# Patient Record
Sex: Male | Born: 1975 | Race: Asian | Hispanic: No | Smoking: Former smoker
Health system: Southern US, Community
[De-identification: ages and names within clinical notes are randomized; demographics above are authoritative.]

## PROBLEM LIST (undated history)

## (undated) DIAGNOSIS — F329 Major depressive disorder, single episode, unspecified: Secondary | ICD-10-CM

## (undated) DIAGNOSIS — F32A Depression, unspecified: Secondary | ICD-10-CM

## (undated) DIAGNOSIS — R200 Anesthesia of skin: Secondary | ICD-10-CM

## (undated) HISTORY — DX: Depression, unspecified: F32.A

## (undated) HISTORY — PX: NO PAST SURGERIES: SHX2092

## (undated) HISTORY — DX: Major depressive disorder, single episode, unspecified: F32.9

## (undated) HISTORY — DX: Anesthesia of skin: R20.0

---

## 2001-08-15 ENCOUNTER — Encounter: Payer: Self-pay | Admitting: Emergency Medicine

## 2001-08-15 ENCOUNTER — Emergency Department (HOSPITAL_COMMUNITY): Admission: EM | Admit: 2001-08-15 | Discharge: 2001-08-15 | Payer: Self-pay | Admitting: Emergency Medicine

## 2008-11-02 ENCOUNTER — Emergency Department (HOSPITAL_COMMUNITY): Admission: EM | Admit: 2008-11-02 | Discharge: 2008-11-03 | Payer: Self-pay | Admitting: Emergency Medicine

## 2009-08-09 ENCOUNTER — Emergency Department (HOSPITAL_COMMUNITY): Admission: EM | Admit: 2009-08-09 | Discharge: 2009-08-09 | Payer: Self-pay | Admitting: Emergency Medicine

## 2011-08-12 ENCOUNTER — Emergency Department (HOSPITAL_COMMUNITY)
Admission: EM | Admit: 2011-08-12 | Discharge: 2011-08-13 | Disposition: A | Discharge status: Home / Self Care | Payer: BC Managed Care – PPO | Attending: Emergency Medicine | Admitting: Emergency Medicine

## 2011-08-12 ENCOUNTER — Emergency Department (HOSPITAL_COMMUNITY): Payer: BC Managed Care – PPO

## 2011-08-12 DIAGNOSIS — S91109A Unspecified open wound of unspecified toe(s) without damage to nail, initial encounter: Secondary | ICD-10-CM | POA: Insufficient documentation

## 2011-08-12 DIAGNOSIS — M79609 Pain in unspecified limb: Secondary | ICD-10-CM | POA: Insufficient documentation

## 2011-08-12 DIAGNOSIS — W208XXA Other cause of strike by thrown, projected or falling object, initial encounter: Secondary | ICD-10-CM | POA: Insufficient documentation

## 2015-04-06 ENCOUNTER — Emergency Department (HOSPITAL_COMMUNITY)
Admission: EM | Admit: 2015-04-06 | Discharge: 2015-04-06 | Disposition: A | Discharge status: Home / Self Care | Payer: 59 | Source: Home / Self Care | Attending: Family Medicine | Admitting: Family Medicine

## 2015-04-06 ENCOUNTER — Encounter (HOSPITAL_COMMUNITY): Payer: Self-pay | Admitting: Emergency Medicine

## 2015-04-06 DIAGNOSIS — S60222A Contusion of left hand, initial encounter: Secondary | ICD-10-CM

## 2015-04-06 NOTE — ED Notes (Signed)
Left hand pain, pain left index finger and hand.  Onset 3 months ago and continues.

## 2015-04-06 NOTE — Discharge Instructions (Signed)
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 See orthopedist if further problems.

## 2015-04-06 NOTE — ED Provider Notes (Signed)
CSN: [id number]     Arrival date & time 04/06/15  1331 History   First MD Initiated Contact with Patient 04/06/15 1414     Chief Complaint  Patient presents with  . Hand Pain   (Consider location/radiation/quality/duration/timing/severity/associated sxs/prior Treatment) Patient is a 39 y.o. male presenting with hand pain. The history is provided by the patient.  Hand Pain This is a chronic problem. The current episode started more than 1 week ago (3mos h/o sorenss). The problem occurs constantly. The problem has been gradually worsening. 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    History reviewed. No pertinent past medical history. History reviewed. No pertinent past surgical history. No family history on file. History  Substance Use Topics  . Smoking status: Not on file  . Smokeless tobacco: Not on file  . Alcohol Use: Not on file    Review of Systems  Constitutional: Negative.   Musculoskeletal: Positive for joint swelling.  Skin: Negative.  Negative for wound.    Allergies  Review of patient's allergies indicates no known allergies.  Home Medications   Prior to Admission medications   Not on File   BP 129/75 mmHg  Pulse 60  Temp(Src) 98.1 F (36.7 C) (Oral)  Resp 18  SpO2 100% Physical Exam  Constitutional: He is oriented to person, place, and time. He appears well-developed and well-nourished. No distress.  Musculoskeletal: He exhibits tenderness.       Left hand: He exhibits tenderness and bony tenderness. He exhibits no deformity and no swelling.       Hands: Neurological: He is alert and oriented to person, place, and time.  Skin: Skin is warm and dry.  Nursing note and vitals reviewed.   ED Course  Procedures (including critical care time) Labs Review Labs Reviewed - No data to display  Imaging Review No results found.   MDM   1. Hand contusion, left, initial encounter        Linna HoffJames D Kindl, MD 04/06/15  1453

## 2015-06-17 ENCOUNTER — Encounter: Payer: Self-pay | Admitting: Neurology

## 2015-06-17 ENCOUNTER — Ambulatory Visit (INDEPENDENT_AMBULATORY_CARE_PROVIDER_SITE_OTHER): Payer: 59 | Admitting: Neurology

## 2015-06-17 VITALS — BP 114/76 | HR 60 | Ht 65.0 in | Wt 145.0 lb

## 2015-06-17 DIAGNOSIS — R202 Paresthesia of skin: Secondary | ICD-10-CM
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
## 2015-08-01 ENCOUNTER — Encounter (HOSPITAL_COMMUNITY): Payer: Self-pay | Admitting: Emergency Medicine

## 2015-08-01 ENCOUNTER — Emergency Department (INDEPENDENT_AMBULATORY_CARE_PROVIDER_SITE_OTHER)
Admission: EM | Admit: 2015-08-01 | Discharge: 2015-08-01 | Disposition: A | Discharge status: Home / Self Care | Payer: 59 | Source: Home / Self Care | Attending: Family Medicine | Admitting: Family Medicine

## 2015-08-01 DIAGNOSIS — L03113 Cellulitis of right upper limb: Secondary | ICD-10-CM | POA: Diagnosis not present

## 2015-08-01 MED ORDER — SULFAMETHOXAZOLE-TRIMETHOPRIM 800-160 MG PO TABS: 1.0000 | ORAL_TABLET | Freq: Two times a day (BID) | ORAL | Status: AC

## 2015-08-01 MED ORDER — LIDOCAINE HCL (PF) 1 % IJ SOLN
INTRAMUSCULAR | Status: AC
  Filled 2015-08-01: qty 5

## 2015-08-01 MED ORDER — CEFTRIAXONE SODIUM 250 MG IJ SOLR
1000.0000 mg | Freq: Once | INTRAMUSCULAR | Status: AC
  Administered 2015-08-01: 1000 mg via INTRAMUSCULAR

## 2015-08-01 MED ORDER — CEFTRIAXONE SODIUM 1 G IJ SOLR
INTRAMUSCULAR | Status: AC
  Filled 2015-08-01: qty 10

## 2015-08-01 NOTE — Discharge Instructions (Signed)
Abscess An abscess is an infected area that contains a collection of pus and debris.It can occur in almost any part of the body. An abscess is also known as a furuncle or boil. CAUSES  An abscess occurs when tissue gets infected. This can occur from blockage of oil or sweat glands, infection of hair follicles, or a minor injury to the skin. As the body tries to fight the infection, pus collects in the area and creates pressure under the skin. This pressure causes pain. People with weakened immune systems have difficulty fighting infections and get certain abscesses more often.  SYMPTOMS Usually an abscess develops on the skin and becomes a painful mass that is red, warm, and tender. If the abscess forms under the skin, you may feel a moveable soft area under the skin. Some abscesses break open (rupture) on their own, but most will continue to get worse without care. The infection can spread deeper into the body and eventually into the bloodstream, causing you to feel ill.  DIAGNOSIS  Your caregiver will take your medical history and perform a physical exam. A sample of fluid may also be taken from the abscess to determine what is causing your infection. TREATMENT  Your caregiver may prescribe antibiotic medicines to fight the infection. However, taking antibiotics alone usually does not cure an abscess. Your caregiver may need to make a small cut (incision) in the abscess to drain the pus. In some cases, gauze is packed into the abscess to reduce pain and to continue draining the area. HOME CARE INSTRUCTIONS   Only take over-the-counter or prescription medicines for pain, discomfort, or fever as directed by your caregiver.  If you were prescribed antibiotics, take them as directed. Finish them even if you start to feel better.  If gauze is used, follow your caregiver's directions for changing the gauze.  To avoid spreading the infection:  Keep your draining abscess covered with a  bandage.  Wash your hands well.  Do not share personal care items, towels, or whirlpools with others.  Avoid skin contact with others.  Keep your skin and clothes clean around the abscess.  Keep all follow-up appointments as directed by your caregiver. SEEK MEDICAL CARE IF:   You have increased pain, swelling, redness, fluid drainage, or bleeding.  You have muscle aches, chills, or a general ill feeling.  You have a fever. MAKE SURE YOU:   Understand these instructions.  Will watch your condition.  Will get help right away if you are not doing well or get worse. Document Released: 09/01/2005 Document Revised: 05/23/2012 Document Reviewed: 02/04/2012 [facility] Patient Information 2015 [place], [place]. This information is not intended to replace advice given to you by your health care provider. Make sure you discuss any questions you have with your health care provider.    We have given you Rocephin here for your infection. I am also going to give you oral antibiotics. Should you worsen or develop high fevers please go to the ER. Please come back in 2 days to let us recheck this abscess.

## 2015-08-01 NOTE — ED Notes (Signed)
Pt is for poss cellulitis to right forearm onset 1 week Sx include swelling, and pain, fevers, chills Believes it may have been an insect bite Alert... No signs of acute distress.

## 2015-08-01 NOTE — ED Provider Notes (Signed)
CSN: [id number]     Arrival date & time 08/01/15  1635 History   First MD Initiated Contact with Patient 08/01/15 1753     Chief Complaint  Patient presents with  . Cellulitis   (Consider location/radiation/quality/duration/timing/severity/associated sxs/prior Treatment) HPI Comments: Patient presents with swelling and possible abscess or right forearm. He reports that it started like a "bite" and began to itch. Over the last week this started to swell, get hot and was painful. Some mild drainage. Mild fevers. No N, V is noted.   The history is provided by the patient.    Past Medical History  Diagnosis Date  . Depression   . Numbness    Past Surgical History  Procedure Laterality Date  . No past surgeries     Family History  Problem Relation Age of Onset  . Hypertension Father    Social History  Substance Use Topics  . Smoking status: Former Games developer  . Smokeless tobacco: None     Comment: Quit 2003  . Alcohol Use: 0.0 oz/week    0 Standard drinks or equivalent per week     Comment: 1 glass per day    Review of Systems  All other systems reviewed and are negative.   Allergies  Review of patient's allergies indicates no known allergies.  Home Medications   Prior to Admission medications   Medication Sig Start Date End Date Taking? Authorizing Provider  diphenhydramine-acetaminophen (TYLENOL PM) 25-500 MG TABS Take 1 tablet by mouth at bedtime as needed.    Historical Provider, MD  sulfamethoxazole-trimethoprim (BACTRIM DS,SEPTRA DS) 800-160 MG per tablet Take 1 tablet by mouth 2 (two) times daily. 08/01/15 08/08/15  Riki Sheer, PA-C  traMADol (ULTRAM) 50 MG tablet Take 50 mg by mouth every 6 (six) hours as needed. 05/21/15   Historical Provider, MD   Meds Ordered and Administered this Visit   Medications  cefTRIAXone (ROCEPHIN) injection 1,000 mg (not administered)    BP 106/66 mmHg  Pulse 60  Temp(Src) 99.1 F (37.3 C) (Oral)  Resp 18  SpO2 100% No data  found.   Physical Exam  Constitutional: He is oriented to person, place, and time. He appears well-developed and well-nourished. No distress.  Musculoskeletal: He exhibits edema and tenderness.  Neurological: He is alert and oriented to person, place, and time.  Skin: Skin is warm and dry. He is not diaphoretic.  Right anterior forearm with swelling, erythema and warmth. No frank abscess, but raised area approx 6x 4 cm. Non-fluctuant.   Psychiatric: His behavior is normal.  Nursing note and vitals reviewed.   ED Course  Procedures (including critical care time)  Labs Review Labs Reviewed - No data to display  Imaging Review No results found.   Visual Acuity Review  Right Eye Distance:   Left Eye Distance:   Bilateral Distance:    Right Eye Near:   Left Eye Near:    Bilateral Near:         MDM   1. Cellulitis of arm, right     With formation of possible abscess though not indurated at  This time. He is febrile with streaking. Treat with Rocephin 1gm here and bactrim. Have him f/u in 2 days for a recheck. If he develops acute symptoms of worsening infection should present to the ER. He expresses understanding.     Riki Sheer, PA-C 08/01/15 [phone]

## 2015-08-01 NOTE — ED Notes (Signed)
Bed: ZO10 Expected date:  Expected time:  Means of arrival:  Comments: Nixed at 1715

## 2016-06-18 ENCOUNTER — Other Ambulatory Visit: Payer: Self-pay | Admitting: Internal Medicine

## 2016-06-18 DIAGNOSIS — R945 Abnormal results of liver function studies: Secondary | ICD-10-CM

## 2016-06-24 ENCOUNTER — Ambulatory Visit
Admission: RE | Admit: 2016-06-24 | Discharge: 2016-06-24 | Disposition: A | Discharge status: Home / Self Care | Payer: BLUE CROSS/BLUE SHIELD | Source: Ambulatory Visit | Attending: Internal Medicine | Admitting: Internal Medicine

## 2016-06-24 DIAGNOSIS — R945 Abnormal results of liver function studies: Secondary | ICD-10-CM

## 2020-09-28 ENCOUNTER — Other Ambulatory Visit: Payer: Self-pay

## 2020-09-28 ENCOUNTER — Emergency Department (HOSPITAL_COMMUNITY): Payer: Self-pay

## 2020-09-28 ENCOUNTER — Encounter (HOSPITAL_COMMUNITY): Payer: Self-pay | Admitting: *Deleted

## 2020-09-28 ENCOUNTER — Inpatient Hospital Stay (HOSPITAL_COMMUNITY)
Admission: EM | Admit: 2020-09-28 | Discharge: 2020-09-30 | DRG: 343 | Disposition: A | Discharge status: Home / Self Care | Payer: Self-pay | Attending: Physician Assistant | Admitting: Physician Assistant

## 2020-09-28 DIAGNOSIS — Z87891 Personal history of nicotine dependence: Secondary | ICD-10-CM

## 2020-09-28 DIAGNOSIS — F32A Depression, unspecified: Secondary | ICD-10-CM | POA: Diagnosis present

## 2020-09-28 DIAGNOSIS — K3580 Unspecified acute appendicitis: Principal | ICD-10-CM | POA: Diagnosis present

## 2020-09-28 DIAGNOSIS — Z8249 Family history of ischemic heart disease and other diseases of the circulatory system: Secondary | ICD-10-CM

## 2020-09-28 DIAGNOSIS — R1031 Right lower quadrant pain: Secondary | ICD-10-CM | POA: Diagnosis present

## 2020-09-28 DIAGNOSIS — Z20822 Contact with and (suspected) exposure to covid-19: Secondary | ICD-10-CM | POA: Diagnosis present

## 2020-09-28 LAB — COMPREHENSIVE METABOLIC PANEL
ALT: 21 U/L (ref 0–44)
AST: 29 U/L (ref 15–41)
Albumin: 4.7 g/dL (ref 3.5–5.0)
Alkaline Phosphatase: 49 U/L (ref 38–126)
Anion gap: 10 (ref 5–15)
BUN: 19 mg/dL (ref 6–20)
CO2: 23 mmol/L (ref 22–32)
Calcium: 9.8 mg/dL (ref 8.9–10.3)
Chloride: 105 mmol/L (ref 98–111)
Creatinine, Ser: 0.79 mg/dL (ref 0.61–1.24)
GFR, Estimated: >60 mL/min (ref >60)
Glucose, Bld: 116 mg/dL — ABNORMAL HIGH (ref 70–99)
Potassium: 3.8 mmol/L (ref 3.5–5.1)
Sodium: 138 mmol/L (ref 135–145)
Total Bilirubin: 0.9 mg/dL (ref 0.3–1.2)
Total Protein: 7.8 g/dL (ref 6.5–8.1)

## 2020-09-28 LAB — CBC WITH DIFFERENTIAL/PLATELET
Abs Immature Granulocytes: 0.05 10*3/uL (ref 0.00–0.07)
Basophils Absolute: 0 10*3/uL (ref 0.0–0.1)
Basophils Relative: 0 %
Eosinophils Absolute: 0 10*3/uL (ref 0.0–0.5)
Eosinophils Relative: 0 %
HCT: 47.5 % (ref 39.0–52.0)
Hemoglobin: 16 g/dL (ref 13.0–17.0)
Immature Granulocytes: 1 %
Lymphocytes Relative: 8 %
Lymphs Abs: 0.8 10*3/uL (ref 0.7–4.0)
MCH: 29.9 pg (ref 26.0–34.0)
MCHC: 33.7 g/dL (ref 30.0–36.0)
MCV: 88.8 fL (ref 80.0–100.0)
Monocytes Absolute: 0.5 10*3/uL (ref 0.1–1.0)
Monocytes Relative: 5 %
Neutro Abs: 8.9 10*3/uL — ABNORMAL HIGH (ref 1.7–7.7)
Neutrophils Relative %: 86 %
Platelets: 233 10*3/uL (ref 150–400)
RBC: 5.35 MIL/uL (ref 4.22–5.81)
RDW: 12.3 % (ref 11.5–15.5)
WBC: 10.2 10*3/uL (ref 4.0–10.5)
nRBC: 0 % (ref 0.0–0.2)

## 2020-09-28 LAB — URINALYSIS, ROUTINE W REFLEX MICROSCOPIC
Bilirubin Urine: NEGATIVE
Glucose, UA: NEGATIVE mg/dL
Hgb urine dipstick: NEGATIVE
Ketones, ur: 20 mg/dL — AB
Leukocytes,Ua: NEGATIVE
Nitrite: NEGATIVE
Protein, ur: NEGATIVE mg/dL
Specific Gravity, Urine: 1.029 (ref 1.005–1.030)
pH: 7 (ref 5.0–8.0)

## 2020-09-28 LAB — RESPIRATORY PANEL BY RT PCR (FLU A&B, COVID)
Influenza A by PCR: NEGATIVE
Influenza B by PCR: NEGATIVE
SARS Coronavirus 2 by RT PCR: NEGATIVE

## 2020-09-28 LAB — LIPASE, BLOOD: Lipase: 25 U/L (ref 11–51)

## 2020-09-28 MED ORDER — DIPHENHYDRAMINE HCL 25 MG PO CAPS: 25.0000 mg | ORAL_CAPSULE | Freq: Four times a day (QID) | ORAL | Status: DC | PRN

## 2020-09-28 MED ORDER — KCL IN DEXTROSE-NACL 20-5-0.45 MEQ/L-%-% IV SOLN
INTRAVENOUS | Status: DC
  Filled 2020-09-28 (×2): qty 1000

## 2020-09-28 MED ORDER — SODIUM CHLORIDE (PF) 0.9 % IJ SOLN
INTRAMUSCULAR | Status: AC
  Filled 2020-09-28: qty 50

## 2020-09-28 MED ORDER — MORPHINE SULFATE (PF) 2 MG/ML IV SOLN: 2.0000 mg | INTRAVENOUS | Status: DC | PRN

## 2020-09-28 MED ORDER — ONDANSETRON HCL 4 MG/2ML IJ SOLN
4.0000 mg | Freq: Four times a day (QID) | INTRAMUSCULAR | Status: DC | PRN
  Administered 2020-09-30: 4 mg via INTRAVENOUS

## 2020-09-28 MED ORDER — ONDANSETRON HCL 4 MG/2ML IJ SOLN
4.0000 mg | Freq: Once | INTRAMUSCULAR | Status: AC
  Administered 2020-09-28: 4 mg via INTRAVENOUS
  Filled 2020-09-28: qty 2

## 2020-09-28 MED ORDER — SODIUM CHLORIDE 0.9 % IV BOLUS
1000.0000 mL | Freq: Once | INTRAVENOUS | Status: AC
  Administered 2020-09-28: 1000 mL via INTRAVENOUS

## 2020-09-28 MED ORDER — IOHEXOL 300 MG/ML  SOLN
100.0000 mL | Freq: Once | INTRAMUSCULAR | Status: AC | PRN
  Administered 2020-09-28: 100 mL via INTRAVENOUS

## 2020-09-28 MED ORDER — SODIUM CHLORIDE 0.9 % IV SOLN
2.0000 g | Freq: Once | INTRAVENOUS | Status: AC
  Administered 2020-09-28: 2 g via INTRAVENOUS
  Filled 2020-09-28: qty 20

## 2020-09-28 MED ORDER — MORPHINE SULFATE (PF) 4 MG/ML IV SOLN
4.0000 mg | Freq: Once | INTRAVENOUS | Status: AC
  Administered 2020-09-28: 4 mg via INTRAVENOUS
  Filled 2020-09-28: qty 1

## 2020-09-28 MED ORDER — ENOXAPARIN SODIUM 40 MG/0.4ML ~~LOC~~ SOLN
40.0000 mg | SUBCUTANEOUS | Status: DC
  Administered 2020-09-28 – 2020-09-29 (×2): 40 mg via SUBCUTANEOUS
  Filled 2020-09-28 (×2): qty 0.4

## 2020-09-28 MED ORDER — ONDANSETRON 4 MG PO TBDP: 4.0000 mg | ORAL_TABLET | Freq: Four times a day (QID) | ORAL | Status: DC | PRN

## 2020-09-28 MED ORDER — DOCUSATE SODIUM 100 MG PO CAPS
100.0000 mg | ORAL_CAPSULE | Freq: Two times a day (BID) | ORAL | Status: DC
  Administered 2020-09-28 – 2020-09-29 (×2): 100 mg via ORAL
  Filled 2020-09-28 (×2): qty 1

## 2020-09-28 MED ORDER — MORPHINE SULFATE (PF) 4 MG/ML IV SOLN
4.0000 mg | Freq: Once | INTRAVENOUS | Status: DC
  Filled 2020-09-28: qty 1

## 2020-09-28 MED ORDER — METRONIDAZOLE IN NACL 5-0.79 MG/ML-% IV SOLN
500.0000 mg | Freq: Once | INTRAVENOUS | Status: AC
  Administered 2020-09-28: 500 mg via INTRAVENOUS
  Filled 2020-09-28: qty 100

## 2020-09-28 MED ORDER — METRONIDAZOLE IN NACL 5-0.79 MG/ML-% IV SOLN
500.0000 mg | Freq: Three times a day (TID) | INTRAVENOUS | Status: DC
  Administered 2020-09-29 – 2020-09-30 (×5): 500 mg via INTRAVENOUS
  Filled 2020-09-28 (×5): qty 100

## 2020-09-28 MED ORDER — SODIUM CHLORIDE 0.9 % IV SOLN
2.0000 g | INTRAVENOUS | Status: DC
  Administered 2020-09-29: 2 g via INTRAVENOUS
  Filled 2020-09-28 (×2): qty 20

## 2020-09-28 MED ORDER — DIPHENHYDRAMINE HCL 50 MG/ML IJ SOLN: 25.0000 mg | Freq: Four times a day (QID) | INTRAMUSCULAR | Status: DC | PRN

## 2020-09-28 NOTE — H&P (Signed)
CC: abd pain  Requesting provider: Dyanne Carrel, PA  HPI: Wayne Bowers is an 44 y.o. male who is here for lower abd pian that started this am.  It is associated with nausea and diarrhea.  Denies any fevers.    Past Medical History:  Diagnosis Date  . Depression   . Numbness     Past Surgical History:  Procedure Laterality Date  . NO PAST SURGERIES      Family History  Problem Relation Age of Onset  . Hypertension Father     Social:  reports that he has quit smoking. He has never used smokeless tobacco. He reports current alcohol use. He reports that he does not use drugs.  Allergies: No Known Allergies  Medications: I have reviewed the patient's current medications.  Results for orders placed or performed during the hospital encounter of 09/28/20 (from the past 48 hour(s))  CBC with Differential     Status: Abnormal   Collection Time: 09/28/20  3:04 PM  Result Value Ref Range   WBC 10.2 4.0 - 10.5 K/uL   RBC 5.35 4.22 - 5.81 MIL/uL   Hemoglobin 16.0 13.0 - 17.0 g/dL   HCT 24.5 39 - 52 %   MCV 88.8 80.0 - 100.0 fL   MCH 29.9 26.0 - 34.0 pg   MCHC 33.7 30.0 - 36.0 g/dL   RDW 80.9 98.3 - 38.2 %   Platelets 233 150 - 400 K/uL   nRBC 0.0 0.0 - 0.2 %   Neutrophils Relative % 86 %   Neutro Abs 8.9 (H) 1.7 - 7.7 K/uL   Lymphocytes Relative 8 %   Lymphs Abs 0.8 0.7 - 4.0 K/uL   Monocytes Relative 5 %   Monocytes Absolute 0.5 0.1 - 1.0 K/uL   Eosinophils Relative 0 %   Eosinophils Absolute 0.0 0.0 - 0.5 K/uL   Basophils Relative 0 %   Basophils Absolute 0.0 0.0 - 0.1 K/uL   Immature Granulocytes 1 %   Abs Immature Granulocytes 0.05 0.00 - 0.07 K/uL    Comment: Performed at [facility], [address], [place], [place] [address]  Comprehensive metabolic panel     Status: Abnormal   Collection Time: 09/28/20  3:04 PM  Result Value Ref Range   Sodium 138 135 - 145 mmol/L   Potassium 3.8 3.5 - 5.1 mmol/L   Chloride 105 98 - 111 mmol/L   CO2 23 22 -  32 mmol/L   Glucose, Bld 116 (H) 70 - 99 mg/dL    Comment: Glucose reference range applies only to samples taken after fasting for at least 8 hours.   BUN 19 6 - 20 mg/dL   Creatinine, Ser 7.67 0.61 - 1.24 mg/dL   Calcium 9.8 8.9 - 34.1 mg/dL   Total Protein 7.8 6.5 - 8.1 g/dL   Albumin 4.7 3.5 - 5.0 g/dL   AST 29 15 - 41 U/L   ALT 21 0 - 44 U/L   Alkaline Phosphatase 49 38 - 126 U/L   Total Bilirubin 0.9 0.3 - 1.2 mg/dL   GFR, Estimated >93 >79 mL/min    Comment: (NOTE) Calculated using the CKD-EPI Creatinine Equation (2021)    Anion gap 10 5 - 15    Comment: Performed at [facility], [address], [place], [place] [address]  Lipase, blood     Status: None   Collection Time: 09/28/20  3:04 PM  Result Value Ref Range   Lipase 25 11 -  51 U/L    Comment: Performed at [facility], [address], [place], [place] [address]  Respiratory Panel by RT PCR (Flu A&B, Covid) - Nasopharyngeal Swab     Status: None   Collection Time: 09/28/20  5:44 PM   Specimen: Nasopharyngeal Swab  Result Value Ref Range   SARS Coronavirus 2 by RT PCR NEGATIVE NEGATIVE    Comment: (NOTE) SARS-CoV-2 target nucleic acids are NOT DETECTED.  The SARS-CoV-2 RNA is generally detectable in upper respiratoy specimens during the acute phase of infection. The lowest concentration of SARS-CoV-2 viral copies this assay can detect is 131 copies/mL. A negative result does not preclude SARS-Cov-2 infection and should not be used as the sole basis for treatment or other patient management decisions. A negative result may occur with  improper specimen collection/handling, submission of specimen other than nasopharyngeal swab, presence of viral mutation(s) within the areas targeted by this assay, and inadequate number of viral copies (<131 copies/mL). A negative result must be combined with clinical observations, patient history, and epidemiological information. The expected  result is Negative.  Fact Sheet for Patients:  https://www.moore.com/  Fact Sheet for Healthcare Providers:  https://www.young.biz/  This test is no t yet approved or cleared by the [place] FDA and  has been authorized for detection and/or diagnosis of SARS-CoV-2 by FDA under an Emergency Use Authorization (EUA). This EUA will remain  in effect (meaning this test can be used) for the duration of the COVID-19 declaration under Section 564(b)(1) of the Act, 21 U.S.C. section 360bbb-3(b)(1), unless the authorization is terminated or revoked sooner.     Influenza A by PCR NEGATIVE NEGATIVE   Influenza B by PCR NEGATIVE NEGATIVE    Comment: (NOTE) The Xpert Xpress SARS-CoV-2/FLU/RSV assay is intended as an aid in  the diagnosis of influenza from Nasopharyngeal swab specimens and  should not be used as a sole basis for treatment. Nasal washings and  aspirates are unacceptable for Xpert Xpress SARS-CoV-2/FLU/RSV  testing.  Fact Sheet for Patients: https://www.moore.com/  Fact Sheet for Healthcare Providers: https://www.young.biz/  This test is not yet approved or cleared by the [place] FDA and  has been authorized for detection and/or diagnosis of SARS-CoV-2 by  FDA under an Emergency Use Authorization (EUA). This EUA will remain  in effect (meaning this test can be used) for the duration of the  Covid-19 declaration under Section 564(b)(1) of the Act, 21  U.S.C. section 360bbb-3(b)(1), unless the authorization is  terminated or revoked. Performed at [facility], [address], [place], [place] [address]   Urinalysis, Routine w reflex microscopic Urine, Clean Catch     Status: Abnormal   Collection Time: 09/28/20  5:56 PM  Result Value Ref Range   Color, Urine YELLOW YELLOW   APPearance CLEAR CLEAR   Specific Gravity, Urine 1.029 1.005 - 1.030   pH 7.0 5.0 - 8.0    Glucose, UA NEGATIVE NEGATIVE mg/dL   Hgb urine dipstick NEGATIVE NEGATIVE   Bilirubin Urine NEGATIVE NEGATIVE   Ketones, ur 20 (A) NEGATIVE mg/dL   Protein, ur NEGATIVE NEGATIVE mg/dL   Nitrite NEGATIVE NEGATIVE   Leukocytes,Ua NEGATIVE NEGATIVE    Comment: Performed at [facility], [address], [place], [place] [address]    CT Abdomen Pelvis W Contrast  Addendum Date: 09/28/2020   ADDENDUM REPORT: 09/28/2020 17:59 ADDENDUM: Findings conveyed Reatha Armour, MD on 09/28/2020  at17:39. Electronically Signed   By: Loura Halt.D.  On: 09/28/2020 17:59   Result Date: 09/28/2020 CLINICAL DATA:  RIGHT lower quadrant pain. EXAM: CT ABDOMEN AND PELVIS WITH CONTRAST TECHNIQUE: Multidetector CT imaging of the abdomen and pelvis was performed using the standard protocol following bolus administration of intravenous contrast. CONTRAST:  OMNIPAQUE IOHEXOL 300 MG/ML  SOLN COMPARISON:  None. FINDINGS: Lower chest: Lung bases are clear. Hepatobiliary: No focal hepatic lesion. No biliary duct dilatation. Common bile duct is normal. Pancreas: Pancreas is normal. No ductal dilatation. No pancreatic inflammation. Spleen: Normal spleen Adrenals/urinary tract: Adrenal glands and kidneys are normal. The ureters and bladder normal. Stomach/Bowel: Stomach, duodenum small-bowel normal. Terminal ileum normal. Appendix is fluid-filled and mildly distended to 8-10 mm (image 54/2 in axial dimension, image 51/6/sagittal imaging). The fluid-filled appendix has mild enhancement of the mucosa. There is no significant inflammation in the fat adjacent to the appendix. No perforation or abscess. Appendix: Location: Typical infra cecal RIGHT lower quadrant. Diameter: 10 mm Appendicolith: Absent Mucosal hyper-enhancement: Mild Extraluminal gas: Absent Periappendiceal collection: Absent The colon and rectosigmoid colon are normal. Vascular/Lymphatic: Abdominal aorta is normal caliber. No periportal or  retroperitoneal adenopathy. No pelvic adenopathy. Reproductive: Prostate unremarkable Other: No free fluid. Musculoskeletal: No aggressive osseous lesion. IMPRESSION: 1. Fluid-filled mildly dilated appendix is concerning for acute appendicitis. No significant peri appendiceal inflammation which could indicate very early appendicitis. Recommend clinical correlation and surgical evaluation. 2. No additional acute findings in the abdomen pelvis. Electronically Signed: By: Genevive Bi M.D. On: 09/28/2020 17:36    ROS - all of the below systems have been reviewed with the patient and positives are indicated with bold text General: chills, fever or night sweats Eyes: blurry vision or double vision ENT: epistaxis or sore throat Allergy/Immunology: itchy/watery eyes or nasal congestion Hematologic/Lymphatic: bleeding problems, blood clots or swollen lymph nodes Endocrine: temperature intolerance or unexpected weight changes Breast: new or changing breast lumps or nipple discharge Resp: cough, shortness of breath, or wheezing CV: chest pain or dyspnea on exertion GI: as per HPI GU: dysuria, trouble voiding, or hematuria MSK: joint pain or joint stiffness Neuro: TIA or stroke symptoms Derm: pruritus and skin lesion changes Psych: anxiety and depression  PE Blood pressure 119/85, pulse 68, temperature 97.6 F (36.4 C), temperature source Oral, resp. rate 18, height  (1.651 m), SpO2 97 %. Constitutional: NAD; conversant; no deformities Eyes: Moist conjunctiva; no lid lag; anicteric; PERRL Neck: Trachea midline; no thyromegaly Lungs: Normal respiratory effort; no tactile fremitus CV: RRR; no palpable thrills; no pitting edema GI: Abd TTP RLQ, mild; no palpable hepatosplenomegaly MSK: Normal range of motion of extremities; no clubbing/cyanosis Psychiatric: Appropriate affect; alert and oriented x3 Lymphatic: No palpable cervical or axillary lymphadenopathy  Results for orders placed or  performed during the hospital encounter of 09/28/20 (from the past 48 hour(s))  CBC with Differential     Status: Abnormal   Collection Time: 09/28/20  3:04 PM  Result Value Ref Range   WBC 10.2 4.0 - 10.5 K/uL   RBC 5.35 4.22 - 5.81 MIL/uL   Hemoglobin 16.0 13.0 - 17.0 g/dL   HCT 54.0 39 - 52 %   MCV 88.8 80.0 - 100.0 fL   MCH 29.9 26.0 - 34.0 pg   MCHC 33.7 30.0 - 36.0 g/dL   RDW 98.1 19.1 - 47.8 %   Platelets 233 150 - 400 K/uL   nRBC 0.0 0.0 - 0.2 %   Neutrophils Relative % 86 %   Neutro Abs 8.9 (H) 1.7 - 7.7 K/uL  Lymphocytes Relative 8 %   Lymphs Abs 0.8 0.7 - 4.0 K/uL   Monocytes Relative 5 %   Monocytes Absolute 0.5 0.1 - 1.0 K/uL   Eosinophils Relative 0 %   Eosinophils Absolute 0.0 0.0 - 0.5 K/uL   Basophils Relative 0 %   Basophils Absolute 0.0 0.0 - 0.1 K/uL   Immature Granulocytes 1 %   Abs Immature Granulocytes 0.05 0.00 - 0.07 K/uL    Comment: Performed at [facility], [address] [place], [place] [address]  Comprehensive metabolic panel     Status: Abnormal   Collection Time: 09/28/20  3:04 PM  Result Value Ref Range   Sodium 138 135 - 145 mmol/L   Potassium 3.8 3.5 - 5.1 mmol/L   Chloride 105 98 - 111 mmol/L   CO2 23 22 - 32 mmol/L   Glucose, Bld 116 (H) 70 - 99 mg/dL    Comment: Glucose reference range applies only to samples taken after fasting for at least 8 hours.   BUN 19 6 - 20 mg/dL   Creatinine, Ser 8.46 0.61 - 1.24 mg/dL   Calcium 9.8 8.9 - 96.2 mg/dL   Total Protein 7.8 6.5 - 8.1 g/dL   Albumin 4.7 3.5 - 5.0 g/dL   AST 29 15 - 41 U/L   ALT 21 0 - 44 U/L   Alkaline Phosphatase 49 38 - 126 U/L   Total Bilirubin 0.9 0.3 - 1.2 mg/dL   GFR, Estimated >95 >28 mL/min    Comment: (NOTE) Calculated using the CKD-EPI Creatinine Equation (2021)    Anion gap 10 5 - 15    Comment: Performed at [facility], [address] [place], [place] [address]  Lipase, blood     Status: None   Collection Time: 09/28/20   3:04 PM  Result Value Ref Range   Lipase 25 11 - 51 U/L    Comment: Performed at [facility], [address], [place], [place] [address]  Respiratory Panel by RT PCR (Flu A&B, Covid) - Nasopharyngeal Swab     Status: None   Collection Time: 09/28/20  5:44 PM   Specimen: Nasopharyngeal Swab  Result Value Ref Range   SARS Coronavirus 2 by RT PCR NEGATIVE NEGATIVE    Comment: (NOTE) SARS-CoV-2 target nucleic acids are NOT DETECTED.  The SARS-CoV-2 RNA is generally detectable in upper respiratoy specimens during the acute phase of infection. The lowest concentration of SARS-CoV-2 viral copies this assay can detect is 131 copies/mL. A negative result does not preclude SARS-Cov-2 infection and should not be used as the sole basis for treatment or other patient management decisions. A negative result may occur with  improper specimen collection/handling, submission of specimen other than nasopharyngeal swab, presence of viral mutation(s) within the areas targeted by this assay, and inadequate number of viral copies (<131 copies/mL). A negative result must be combined with clinical observations, patient history, and epidemiological information. The expected result is Negative.  Fact Sheet for Patients:  https://www.moore.com/  Fact Sheet for Healthcare Providers:  https://www.young.biz/  This test is no t yet approved or cleared by the [place] FDA and  has been authorized for detection and/or diagnosis of SARS-CoV-2 by FDA under an Emergency Use Authorization (EUA). This EUA will remain  in effect (meaning this test can be used) for the duration of the COVID-19 declaration under Section 564(b)(1) of the Act, 21 U.S.C. section 360bbb-3(b)(1), unless the authorization is terminated or revoked sooner.  Influenza A by PCR NEGATIVE NEGATIVE   Influenza B by PCR NEGATIVE NEGATIVE    Comment: (NOTE) The Xpert Xpress  SARS-CoV-2/FLU/RSV assay is intended as an aid in  the diagnosis of influenza from Nasopharyngeal swab specimens and  should not be used as a sole basis for treatment. Nasal washings and  aspirates are unacceptable for Xpert Xpress SARS-CoV-2/FLU/RSV  testing.  Fact Sheet for Patients: https://www.moore.com/https://www.fda.gov/media/142436/download  Fact Sheet for Healthcare Providers: https://www.young.biz/https://www.fda.gov/media/142435/download  This test is not yet approved or cleared by the [place] FDA and  has been authorized for detection and/or diagnosis of SARS-CoV-2 by  FDA under an Emergency Use Authorization (EUA). This EUA will remain  in effect (meaning this test can be used) for the duration of the  Covid-19 declaration under Section 564(b)(1) of the Act, 21  U.S.C. section 360bbb-3(b)(1), unless the authorization is  terminated or revoked. Performed at [facility], [address] [place], [place] [address]   Urinalysis, Routine w reflex microscopic Urine, Clean Catch     Status: Abnormal   Collection Time: 09/28/20  5:56 PM  Result Value Ref Range   Color, Urine YELLOW YELLOW   APPearance CLEAR CLEAR   Specific Gravity, Urine 1.029 1.005 - 1.030   pH 7.0 5.0 - 8.0   Glucose, UA NEGATIVE NEGATIVE mg/dL   Hgb urine dipstick NEGATIVE NEGATIVE   Bilirubin Urine NEGATIVE NEGATIVE   Ketones, ur 20 (A) NEGATIVE mg/dL   Protein, ur NEGATIVE NEGATIVE mg/dL   Nitrite NEGATIVE NEGATIVE   Leukocytes,Ua NEGATIVE NEGATIVE    Comment: Performed at [facility], [address] [place], [place] [address]    CT Abdomen Pelvis W Contrast  Addendum Date: 09/28/2020   ADDENDUM REPORT: 09/28/2020 17:59 ADDENDUM: Findings conveyed Reatha ArmourtoLAURA MURPHY, MD on 09/28/2020  at17:39. Electronically Signed   By: Genevive BiStewart  Edmunds M.D.   On: 09/28/2020 17:59   Result Date: 09/28/2020 CLINICAL DATA:  RIGHT lower quadrant pain. EXAM: CT ABDOMEN AND PELVIS WITH CONTRAST TECHNIQUE:  Multidetector CT imaging of the abdomen and pelvis was performed using the standard protocol following bolus administration of intravenous contrast. CONTRAST:  100mL OMNIPAQUE IOHEXOL 300 MG/ML  SOLN COMPARISON:  None. FINDINGS: Lower chest: Lung bases are clear. Hepatobiliary: No focal hepatic lesion. No biliary duct dilatation. Common bile duct is normal. Pancreas: Pancreas is normal. No ductal dilatation. No pancreatic inflammation. Spleen: Normal spleen Adrenals/urinary tract: Adrenal glands and kidneys are normal. The ureters and bladder normal. Stomach/Bowel: Stomach, duodenum small-bowel normal. Terminal ileum normal. Appendix is fluid-filled and mildly distended to 8-10 mm (image 54/2 in axial dimension, image 51/6/sagittal imaging). The fluid-filled appendix has mild enhancement of the mucosa. There is no significant inflammation in the fat adjacent to the appendix. No perforation or abscess. Appendix: Location: Typical infra cecal RIGHT lower quadrant. Diameter: 10 mm Appendicolith: Absent Mucosal hyper-enhancement: Mild Extraluminal gas: Absent Periappendiceal collection: Absent The colon and rectosigmoid colon are normal. Vascular/Lymphatic: Abdominal aorta is normal caliber. No periportal or retroperitoneal adenopathy. No pelvic adenopathy. Reproductive: Prostate unremarkable Other: No free fluid. Musculoskeletal: No aggressive osseous lesion. IMPRESSION: 1. Fluid-filled mildly dilated appendix is concerning for acute appendicitis. No significant peri appendiceal inflammation which could indicate very early appendicitis. Recommend clinical correlation and surgical evaluation. 2. No additional acute findings in the abdomen pelvis. Electronically Signed: By: Genevive BiStewart  Edmunds M.D. On: 09/28/2020 17:36     A/P: Luna FuseSue Stonehocker is an 44 y.o. male with RLQ pain and possible early appendicitis on CT scan.  Will admit for overnight observation and re-evaluate in AM.  If pain worsens, will plan on proceeding with  lap appy.   Vanita Panda, MD  Colorectal and General Surgery [facility]

## 2020-09-28 NOTE — ED Provider Notes (Signed)
[facility]-EMERGENCY DEPT Provider Note   CSN: [id number] Arrival date & time: 09/28/20  1411     History No chief complaint on file.   Wayne Bowers is a 44 y.o. male.  44 year old male presents with complaint of lower abdominal pain with nausea and diarrhea.  Patient states symptoms woke him from his sleep this morning and have been constant.  No known sick contacts, states he had a similar episode about 10 years ago and believes he had food poisoning at that time. Patient reports social alcohol use, last had alcohol 2 days ago, non-smoker, no prior abdominal surgeries, no significant medical history.         Past Medical History:  Diagnosis Date  . Depression   . Numbness     There are no problems to display for this patient.   Past Surgical History:  Procedure Laterality Date  . NO PAST SURGERIES         Family History  Problem Relation Age of Onset  . Hypertension Father     Social History   Tobacco Use  . Smoking status: Former Games developer  . Smokeless tobacco: Never Used  . Tobacco comment: Quit 2003  Substance Use Topics  . Alcohol use: Yes    Alcohol/week: 0.0 standard drinks    Comment: 1 glass per day  . Drug use: No    Home Medications Prior to Admission medications   Medication Sig Start Date End Date Taking? Authorizing Provider  ibuprofen (ADVIL) 200 MG tablet Take 400 mg by mouth at bedtime.   Yes [provider]  Simethicone (GAS-X PO) Take 1 tablet by mouth once.   Yes [provider]    Allergies    Patient has no known allergies.  Review of Systems   Review of Systems  Constitutional: Negative for chills, diaphoresis and fever.  Respiratory: Negative for shortness of breath.   Cardiovascular: Negative for chest pain.  Gastrointestinal: Positive for diarrhea and nausea. Negative for blood in stool, constipation and vomiting.  Genitourinary: Negative for dysuria and frequency.  Musculoskeletal:  Negative for back pain.  Skin: Negative for rash and wound.  Allergic/Immunologic: Negative for immunocompromised state.  Neurological: Negative for weakness.  All other systems reviewed and are negative.   Physical Exam Updated Vital Signs BP 119/85   Pulse 68   Temp 97.6 F (36.4 C) (Oral)   Resp 18   Ht 5\' 5"  (1.651 m)   SpO2 97%   BMI 24.13 kg/m   Physical Exam Vitals and nursing note reviewed.  Constitutional:      General: He is not in acute distress.    Appearance: He is well-developed. He is not diaphoretic.  HENT:     Head: Normocephalic and atraumatic.  Cardiovascular:     Rate and Rhythm: Normal rate and regular rhythm.     Pulses: Normal pulses.     Heart sounds: Normal heart sounds.  Pulmonary:     Effort: Pulmonary effort is normal.     Breath sounds: Normal breath sounds.  Abdominal:     Palpations: Abdomen is soft.     Tenderness: There is abdominal tenderness in the right lower quadrant, suprapubic area and left lower quadrant. There is no right CVA tenderness, left CVA tenderness, guarding or rebound.  Musculoskeletal:     Right lower leg: No edema.     Left lower leg: No edema.  Skin:    General: Skin is warm and dry.  Findings: Erythema present. No rash.     Comments: Redness to abdomen which patient states is from applying a heating pad to his skin.  Neurological:     Mental Status: He is alert and oriented to person, place, and time.  Psychiatric:        Behavior: Behavior normal.     ED Results / Procedures / Treatments   Labs (all labs ordered are listed, but only abnormal results are displayed) Labs Reviewed  CBC WITH DIFFERENTIAL/PLATELET - Abnormal; Notable for the following components:      Result Value   Neutro Abs 8.9 (*)    All other components within normal limits  COMPREHENSIVE METABOLIC PANEL - Abnormal; Notable for the following components:   Glucose, Bld 116 (*)    All other components within normal limits   URINALYSIS, ROUTINE W REFLEX MICROSCOPIC - Abnormal; Notable for the following components:   Ketones, ur 20 (*)    All other components within normal limits  RESPIRATORY PANEL BY RT PCR (FLU A&B, COVID)  LIPASE, BLOOD    EKG None  Radiology CT Abdomen Pelvis W Contrast  Addendum Date: 09/28/2020   ADDENDUM REPORT: 09/28/2020 17:59 ADDENDUM: Findings conveyed Reatha Armour, MD on 09/28/2020  at17:39. Electronically Signed   By: Genevive Bi M.D.   On: 09/28/2020 17:59   Result Date: 09/28/2020 CLINICAL DATA:  RIGHT lower quadrant pain. EXAM: CT ABDOMEN AND PELVIS WITH CONTRAST TECHNIQUE: Multidetector CT imaging of the abdomen and pelvis was performed using the standard protocol following bolus administration of intravenous contrast. CONTRAST:  OMNIPAQUE IOHEXOL 300 MG/ML  SOLN COMPARISON:  None. FINDINGS: Lower chest: Lung bases are clear. Hepatobiliary: No focal hepatic lesion. No biliary duct dilatation. Common bile duct is normal. Pancreas: Pancreas is normal. No ductal dilatation. No pancreatic inflammation. Spleen: Normal spleen Adrenals/urinary tract: Adrenal glands and kidneys are normal. The ureters and bladder normal. Stomach/Bowel: Stomach, duodenum small-bowel normal. Terminal ileum normal. Appendix is fluid-filled and mildly distended to 8-10 mm (image 54/2 in axial dimension, image 51/6/sagittal imaging). The fluid-filled appendix has mild enhancement of the mucosa. There is no significant inflammation in the fat adjacent to the appendix. No perforation or abscess. Appendix: Location: Typical infra cecal RIGHT lower quadrant. Diameter: 10 mm Appendicolith: Absent Mucosal hyper-enhancement: Mild Extraluminal gas: Absent Periappendiceal collection: Absent The colon and rectosigmoid colon are normal. Vascular/Lymphatic: Abdominal aorta is normal caliber. No periportal or retroperitoneal adenopathy. No pelvic adenopathy. Reproductive: Prostate unremarkable Other: No free fluid.  Musculoskeletal: No aggressive osseous lesion. IMPRESSION: 1. Fluid-filled mildly dilated appendix is concerning for acute appendicitis. No significant peri appendiceal inflammation which could indicate very early appendicitis. Recommend clinical correlation and surgical evaluation. 2. No additional acute findings in the abdomen pelvis. Electronically Signed: By: Genevive Bi M.D. On: 09/28/2020 17:36    Procedures Procedures (including critical care time)  Medications Ordered in ED Medications  sodium chloride (PF) 0.9 % injection (has no administration in time range)  morphine 4 MG/ML injection 4 mg (0 mg Intravenous Hold 09/28/20 1910)  sodium chloride 0.9 % bolus 1,000 mL (0 mLs Intravenous Stopped 09/28/20 1659)  ondansetron (ZOFRAN) injection 4 mg (4 mg Intravenous Given 09/28/20 1500)  morphine 4 MG/ML injection 4 mg (4 mg Intravenous Given 09/28/20 1501)  iohexol (OMNIPAQUE) 300 MG/ML solution 100 mL (100 mLs Intravenous Contrast Given 09/28/20 1706)  morphine 4 MG/ML injection 4 mg (4 mg Intravenous Given 09/28/20 1717)  cefTRIAXone (ROCEPHIN) 2 g in sodium chloride 0.9 % 100 mL IVPB (  2 g Intravenous New Bag/Given 09/28/20 1845)    And  metroNIDAZOLE (FLAGYL) IVPB 500 mg (500 mg Intravenous New Bag/Given 09/28/20 1900)    ED Course  I have reviewed the triage vital signs and the nursing notes.  Pertinent labs & imaging results that were available during my care of the patient were reviewed by me and considered in my medical decision making (see chart for details).  Clinical Course as of Sep 28 2005  Wynelle Link Sep 28, 2020  655 44 year old male presents with complaint of lower abdominal pain with nausea and diarrhea.  Patient states symptoms woke him from his sleep this morning and have been constant.  No known sick contacts, states he had a similar episode about 10 years ago and believes he had food poisoning at that time. Patient reports social alcohol use, last had alcohol 2  days ago, non-smoker, no prior abdominal surgeries, no significant medical history.  On exam found to have lower abdominal tenderness, appears to feel uncomfortable.  Vitals reviewed, patient is hypertensive, afebrile.   [LM]  1801 Labs reviewed, no leukocytosis, CMP unremarkable, lipase WNL.  CT abdomen and pelvis with contrast, fluid filled mildly dilated appendix concerning for acute appendicitis. Discussed with Dr. Maisie Fus, on call with general surgery, labs and imaging reviewed, discussed 23 hour observation vs antibiotics and recheck in 24 hours.    [LM]  1900 Repeat abdominal exam, more focally tender in RLQ with rebound tenderness. Plan is for antibiotics and additional pain medication, and reassess.    [LM]  2000 Repeat abdominal exam, continues to be tender in the RLQ. Will discuss with general surgery. Patient is hesitant to go home at this time.    [LM]  2005 Discussed with Dr. Maisie Fus with general surgery who will place orders for admission for observation.    [LM]    Clinical Course User Index [LM] Alden Hipp   MDM Rules/Calculators/A&P                          Final Clinical Impression(s) / ED Diagnoses Final diagnoses:  Right lower quadrant abdominal pain    Rx / DC Orders ED Discharge Orders    None       Alden Hipp 09/28/20 2006    Little, Ambrose Finland, MD 10/01/20 [phone]

## 2020-09-28 NOTE — ED Triage Notes (Signed)
Pt BIB EMS and presents with abd pain that began around 5am.  Pt states the pain woke up him up from sleep.  EMS reports pt's abd is tender to palpation LLQ below umbilicus. Pt also reports nausea and diarrhea. Pt a/o x 4 and ambulatory.

## 2020-09-29 LAB — BASIC METABOLIC PANEL
Anion gap: 10 (ref 5–15)
BUN: 15 mg/dL (ref 6–20)
CO2: 24 mmol/L (ref 22–32)
Calcium: 8.9 mg/dL (ref 8.9–10.3)
Chloride: 105 mmol/L (ref 98–111)
Creatinine, Ser: 0.87 mg/dL (ref 0.61–1.24)
GFR, Estimated: >60 mL/min (ref >60)
Glucose, Bld: 100 mg/dL — ABNORMAL HIGH (ref 70–99)
Potassium: 3.6 mmol/L (ref 3.5–5.1)
Sodium: 139 mmol/L (ref 135–145)

## 2020-09-29 LAB — CBC
HCT: 42.7 % (ref 39.0–52.0)
Hemoglobin: 13.9 g/dL (ref 13.0–17.0)
MCH: 29.4 pg (ref 26.0–34.0)
MCHC: 32.6 g/dL (ref 30.0–36.0)
MCV: 90.3 fL (ref 80.0–100.0)
Platelets: 230 10*3/uL (ref 150–400)
RBC: 4.73 MIL/uL (ref 4.22–5.81)
RDW: 12.5 % (ref 11.5–15.5)
WBC: 7.6 10*3/uL (ref 4.0–10.5)
nRBC: 0 % (ref 0.0–0.2)

## 2020-09-29 LAB — HIV ANTIBODY (ROUTINE TESTING W REFLEX): HIV Screen 4th Generation wRfx: NONREACTIVE

## 2020-09-29 MED ORDER — ACETAMINOPHEN 325 MG PO TABS: 650.0000 mg | ORAL_TABLET | Freq: Four times a day (QID) | ORAL | Status: DC | PRN

## 2020-09-29 NOTE — Progress Notes (Signed)
[facility] Progress Note     Subjective: CC-  Overall feeling better but does still have some mild RLQ pain. No pain at rest, pain is worse with movement or palpation. Nausea and diarrhea have resolved.  WBC 7.6, afebrile  Objective: Vital signs in last 24 hours: Temp:  [97.6 F (36.4 C)-98.7 F (37.1 C)] 98.5 F (36.9 C) (10/25 0937) Pulse Rate:  [55-73] 60 (10/25 0937) Resp:  [16-20] 20 (10/25 0937) BP: (114-144)/(73-104) 124/92 (10/25 0937) SpO2:  [95 %-100 %] 100 % (10/25 0937) Weight:  [69.4 kg] 69.4 kg (10/24 2152) Last BM Date: 09/28/20  Intake/Output from previous day: 10/24 0701 - 10/25 0700 In: 100 [IV Piggyback:100] Out: -  Intake/Output this shift: No intake/output data recorded.  PE: Gen:  Alert, NAD, pleasant HEENT: EOM's intact, pupils equal and round Card:  RRR Pulm:  CTAB, no W/R/R, rate and effort normal Abd: Soft, ND, subjective RLQ TTP, +BS, no HSM Psych: A&Ox4  Skin: no rashes noted, warm and dry  Lab Results:  Recent Labs    09/28/20 1504 09/29/20 0407  WBC 10.2 7.6  HGB 16.0 13.9  HCT 47.5 42.7  PLT 233 230   BMET Recent Labs    09/28/20 1504 09/29/20 0407  NA 138 139  K 3.8 3.6  CL 105 105  CO2 23 24  GLUCOSE 116* 100*  BUN 19 15  CREATININE 0.79 0.87  CALCIUM 9.8 8.9   PT/INR No results for input(s): LABPROT, INR in the last 72 hours. CMP     Component Value Date/Time   NA 139 09/29/2020 0407   K 3.6 09/29/2020 0407   CL 105 09/29/2020 0407   CO2 24 09/29/2020 0407   GLUCOSE 100 (H) 09/29/2020 0407   BUN 15 09/29/2020 0407   CREATININE 0.87 09/29/2020 0407   CALCIUM 8.9 09/29/2020 0407   PROT 7.8 09/28/2020 1504   ALBUMIN 4.7 09/28/2020 1504   AST 29 09/28/2020 1504   ALT 21 09/28/2020 1504   ALKPHOS 49 09/28/2020 1504   BILITOT 0.9 09/28/2020 1504   GFRNONAA >60 09/29/2020 0407   Lipase     Component Value Date/Time   LIPASE 25 09/28/2020 1504       Studies/Results: CT Abdomen Pelvis W  Contrast  Addendum Date: 09/28/2020   ADDENDUM REPORT: 09/28/2020 17:59 ADDENDUM: Findings conveyed Reatha Armour, MD on 09/28/2020  at17:39. Electronically Signed   By: Genevive Bi M.D.   On: 09/28/2020 17:59   Result Date: 09/28/2020 CLINICAL DATA:  RIGHT lower quadrant pain. EXAM: CT ABDOMEN AND PELVIS WITH CONTRAST TECHNIQUE: Multidetector CT imaging of the abdomen and pelvis was performed using the standard protocol following bolus administration of intravenous contrast. CONTRAST:  OMNIPAQUE IOHEXOL 300 MG/ML  SOLN COMPARISON:  None. FINDINGS: Lower chest: Lung bases are clear. Hepatobiliary: No focal hepatic lesion. No biliary duct dilatation. Common bile duct is normal. Pancreas: Pancreas is normal. No ductal dilatation. No pancreatic inflammation. Spleen: Normal spleen Adrenals/urinary tract: Adrenal glands and kidneys are normal. The ureters and bladder normal. Stomach/Bowel: Stomach, duodenum small-bowel normal. Terminal ileum normal. Appendix is fluid-filled and mildly distended to 8-10 mm (image 54/2 in axial dimension, image 51/6/sagittal imaging). The fluid-filled appendix has mild enhancement of the mucosa. There is no significant inflammation in the fat adjacent to the appendix. No perforation or abscess. Appendix: Location: Typical infra cecal RIGHT lower quadrant. Diameter: 10 mm Appendicolith: Absent Mucosal hyper-enhancement: Mild Extraluminal gas: Absent Periappendiceal collection: Absent The colon and rectosigmoid colon are  normal. Vascular/Lymphatic: Abdominal aorta is normal caliber. No periportal or retroperitoneal adenopathy. No pelvic adenopathy. Reproductive: Prostate unremarkable Other: No free fluid. Musculoskeletal: No aggressive osseous lesion. IMPRESSION: 1. Fluid-filled mildly dilated appendix is concerning for acute appendicitis. No significant peri appendiceal inflammation which could indicate very early appendicitis. Recommend clinical correlation and surgical  evaluation. 2. No additional acute findings in the abdomen pelvis. Electronically Signed: By: Genevive Bi M.D. On: 09/28/2020 17:36    Anti-infectives: Anti-infectives (From admission, onward)   Start     Dose/Rate Route Frequency Ordered Stop   09/29/20 1800  cefTRIAXone (ROCEPHIN) 2 g in sodium chloride 0.9 % 100 mL IVPB       "And" Linked Group Details   2 g 200 mL/hr over 30 Minutes Intravenous Every 24 hours 09/28/20 2240     09/29/20 0200  metroNIDAZOLE (FLAGYL) IVPB 500 mg       "And" Linked Group Details   500 mg 100 mL/hr over 60 Minutes Intravenous Every 8 hours 09/28/20 2240     09/28/20 1815  cefTRIAXone (ROCEPHIN) 2 g in sodium chloride 0.9 % 100 mL IVPB       "And" Linked Group Details   2 g 200 mL/hr over 30 Minutes Intravenous  Once 09/28/20 1801 09/28/20 2013   09/28/20 1815  metroNIDAZOLE (FLAGYL) IVPB 500 mg       "And" Linked Group Details   500 mg 100 mL/hr over 60 Minutes Intravenous  Once 09/28/20 1801 09/28/20 2000       Assessment/Plan  RLQ pain, ?early appendicitis - CT scan equivocal for appendicitis  - Symptoms improving. Only subjective RLQ tenderness on abdominal exam, WBC WNL and VSS. Discussed with MD, continue nonoperative management for now. Continue IV antibiotics. Ok for clear liquids today. Mobilize. Repeat CBC in AM. If symptoms do not resolve will proceed with laparoscopic appendectomy. NPO after midnight in case he needs surgery.  ID - rocephin/flagyl 10/24>> FEN - IVF, CLD, NPO after MN VTE - SCDs, lovenox Foley - none Follow up - TBD   LOS: 0 days    Franne Forts, PA-C [facility] 09/29/2020, 11:00 AM Please see Amion for pager number during day hours 7:00am-4:30pm

## 2020-09-29 NOTE — Plan of Care (Addendum)
Alert and oriented. C/o mild pain on abdomen, did not require pain medications. Continuous IV fluids. On NPO post midnight. Able to ambulate to the bathroom. Call bell within reach  Patient c/o pressure on hi right ear.  Problem: Education: Goal: Knowledge of General Education information will improve Description: Including pain rating scale, medication(s)/side effects and non-pharmacologic comfort measures Outcome: Progressing   Problem: Clinical Measurements: Goal: Ability to maintain clinical measurements within normal limits will improve Outcome: Progressing Goal: Will remain free from infection Outcome: Progressing Goal: Diagnostic test results will improve Outcome: Progressing Goal: Respiratory complications will improve Outcome: Progressing Goal: Cardiovascular complication will be avoided Outcome: Progressing   Problem: Activity: Goal: Risk for activity intolerance will decrease Outcome: Progressing   Problem: Nutrition: Goal: Adequate nutrition will be maintained Outcome: Progressing   Problem: Pain Managment: Goal: General experience of comfort will improve Outcome: Progressing   Problem: Safety: Goal: Ability to remain free from injury will improve Outcome: Progressing   Problem: Skin Integrity: Goal: Risk for impaired skin integrity will decrease Outcome: Progressing

## 2020-09-30 ENCOUNTER — Inpatient Hospital Stay (HOSPITAL_COMMUNITY): Payer: Self-pay | Admitting: Certified Registered"

## 2020-09-30 ENCOUNTER — Encounter (HOSPITAL_COMMUNITY): Admission: EM | Disposition: A | Discharge status: Home / Self Care | Payer: Self-pay | Source: Home / Self Care

## 2020-09-30 ENCOUNTER — Encounter (HOSPITAL_COMMUNITY): Payer: Self-pay

## 2020-09-30 HISTORY — PX: LAPAROSCOPIC APPENDECTOMY: SHX408

## 2020-09-30 LAB — CBC
HCT: 43.1 % (ref 39.0–52.0)
Hemoglobin: 14.2 g/dL (ref 13.0–17.0)
MCH: 29.6 pg (ref 26.0–34.0)
MCHC: 32.9 g/dL (ref 30.0–36.0)
MCV: 90 fL (ref 80.0–100.0)
Platelets: 227 10*3/uL (ref 150–400)
RBC: 4.79 MIL/uL (ref 4.22–5.81)
RDW: 12.5 % (ref 11.5–15.5)
WBC: 6.6 10*3/uL (ref 4.0–10.5)
nRBC: 0 % (ref 0.0–0.2)

## 2020-09-30 SURGERY — APPENDECTOMY, LAPAROSCOPIC
Anesthesia: General | Site: Abdomen

## 2020-09-30 MED ORDER — ROCURONIUM BROMIDE 10 MG/ML (PF) SYRINGE
PREFILLED_SYRINGE | INTRAVENOUS | Status: DC | PRN
  Administered 2020-09-30: 60 mg via INTRAVENOUS
  Administered 2020-09-30: 10 mg via INTRAVENOUS

## 2020-09-30 MED ORDER — BUPIVACAINE-EPINEPHRINE (PF) 0.5% -1:200000 IJ SOLN
INTRAMUSCULAR | Status: AC
  Filled 2020-09-30: qty 30

## 2020-09-30 MED ORDER — ACETAMINOPHEN 325 MG PO TABS: 650.0000 mg | ORAL_TABLET | Freq: Four times a day (QID) | ORAL | Status: DC | PRN

## 2020-09-30 MED ORDER — KETAMINE HCL 10 MG/ML IJ SOLN
INTRAMUSCULAR | Status: AC
  Filled 2020-09-30: qty 1

## 2020-09-30 MED ORDER — HYDROMORPHONE HCL 1 MG/ML IJ SOLN
INTRAMUSCULAR | Status: AC
  Filled 2020-09-30: qty 1

## 2020-09-30 MED ORDER — LACTATED RINGERS IV SOLN: INTRAVENOUS | Status: DC

## 2020-09-30 MED ORDER — LACTATED RINGERS IV SOLN
INTRAVENOUS | Status: AC | PRN
  Administered 2020-09-30: 1000 mL

## 2020-09-30 MED ORDER — BUPIVACAINE-EPINEPHRINE 0.5% -1:200000 IJ SOLN
INTRAMUSCULAR | Status: DC | PRN
  Administered 2020-09-30: 30 mL

## 2020-09-30 MED ORDER — OXYCODONE HCL 5 MG PO TABS: 5.0000 mg | ORAL_TABLET | ORAL | Status: DC | PRN

## 2020-09-30 MED ORDER — MIDAZOLAM HCL 2 MG/2ML IJ SOLN
INTRAMUSCULAR | Status: AC
  Filled 2020-09-30: qty 2

## 2020-09-30 MED ORDER — HYDROMORPHONE HCL 1 MG/ML IJ SOLN
INTRAMUSCULAR | Status: AC
  Administered 2020-09-30: 0.5 mg via INTRAVENOUS
  Filled 2020-09-30: qty 1

## 2020-09-30 MED ORDER — MEPERIDINE HCL 50 MG/ML IJ SOLN: 6.2500 mg | INTRAMUSCULAR | Status: DC | PRN

## 2020-09-30 MED ORDER — CHLORHEXIDINE GLUCONATE CLOTH 2 % EX PADS
6.0000 | MEDICATED_PAD | Freq: Once | CUTANEOUS | Status: AC
  Administered 2020-09-30: 6 via TOPICAL

## 2020-09-30 MED ORDER — DEXAMETHASONE SODIUM PHOSPHATE 10 MG/ML IJ SOLN
INTRAMUSCULAR | Status: DC | PRN
  Administered 2020-09-30: 6 mg via INTRAVENOUS

## 2020-09-30 MED ORDER — LIDOCAINE 2% (20 MG/ML) 5 ML SYRINGE
INTRAMUSCULAR | Status: DC | PRN
  Administered 2020-09-30: 30 mg via INTRAVENOUS

## 2020-09-30 MED ORDER — SUGAMMADEX SODIUM 200 MG/2ML IV SOLN
INTRAVENOUS | Status: DC | PRN
  Administered 2020-09-30: 140 mg via INTRAVENOUS

## 2020-09-30 MED ORDER — HYDROMORPHONE HCL 1 MG/ML IJ SOLN: 1.0000 mg | INTRAMUSCULAR | Status: DC | PRN

## 2020-09-30 MED ORDER — EPHEDRINE SULFATE-NACL 50-0.9 MG/10ML-% IV SOSY
PREFILLED_SYRINGE | INTRAVENOUS | Status: DC | PRN
  Administered 2020-09-30: 5 mg via INTRAVENOUS

## 2020-09-30 MED ORDER — SODIUM CHLORIDE 0.45 % IV SOLN: INTRAVENOUS | Status: DC

## 2020-09-30 MED ORDER — CHLORHEXIDINE GLUCONATE CLOTH 2 % EX PADS: 6.0000 | MEDICATED_PAD | Freq: Once | CUTANEOUS | Status: DC

## 2020-09-30 MED ORDER — POLYETHYLENE GLYCOL 3350 17 G PO PACK: 17.0000 g | PACK | Freq: Every day | ORAL | 0 refills | Status: AC | PRN

## 2020-09-30 MED ORDER — ACETAMINOPHEN 325 MG PO TABS: 650.0000 mg | ORAL_TABLET | Freq: Four times a day (QID) | ORAL | Status: AC | PRN

## 2020-09-30 MED ORDER — KETAMINE HCL 10 MG/ML IJ SOLN
INTRAMUSCULAR | Status: DC | PRN
  Administered 2020-09-30: 30 mg via INTRAVENOUS

## 2020-09-30 MED ORDER — OXYCODONE HCL 5 MG PO TABS: 5.0000 mg | ORAL_TABLET | Freq: Once | ORAL | Status: DC | PRN

## 2020-09-30 MED ORDER — TRAMADOL HCL 50 MG PO TABS: 50.0000 mg | ORAL_TABLET | Freq: Four times a day (QID) | ORAL | Status: DC | PRN

## 2020-09-30 MED ORDER — OXYCODONE HCL 5 MG/5ML PO SOLN: 5.0000 mg | Freq: Once | ORAL | Status: DC | PRN

## 2020-09-30 MED ORDER — LACTATED RINGERS IV SOLN: INTRAVENOUS | Status: DC | PRN

## 2020-09-30 MED ORDER — ONDANSETRON 4 MG PO TBDP: 4.0000 mg | ORAL_TABLET | Freq: Four times a day (QID) | ORAL | Status: DC | PRN

## 2020-09-30 MED ORDER — PROPOFOL 10 MG/ML IV BOLUS
INTRAVENOUS | Status: DC | PRN
  Administered 2020-09-30: 200 mg via INTRAVENOUS

## 2020-09-30 MED ORDER — ACETAMINOPHEN 650 MG RE SUPP: 650.0000 mg | Freq: Four times a day (QID) | RECTAL | Status: DC | PRN

## 2020-09-30 MED ORDER — BUPIVACAINE HCL 0.25 % IJ SOLN
INTRAMUSCULAR | Status: AC
  Filled 2020-09-30: qty 1

## 2020-09-30 MED ORDER — OXYCODONE HCL 5 MG PO TABS
5.0000 mg | ORAL_TABLET | Freq: Four times a day (QID) | ORAL | 0 refills | Status: AC | PRN
Start: 2020-09-30 — End: ?

## 2020-09-30 MED ORDER — MIDAZOLAM HCL 2 MG/2ML IJ SOLN: 0.5000 mg | Freq: Once | INTRAMUSCULAR | Status: DC | PRN

## 2020-09-30 MED ORDER — FENTANYL CITRATE (PF) 100 MCG/2ML IJ SOLN
INTRAMUSCULAR | Status: AC
  Filled 2020-09-30: qty 2

## 2020-09-30 MED ORDER — LIDOCAINE 20MG/ML (2%) 15 ML SYRINGE OPTIME
INTRAMUSCULAR | Status: DC | PRN
  Administered 2020-09-30: 1.5 mg/kg/h via INTRAVENOUS

## 2020-09-30 MED ORDER — 0.9 % SODIUM CHLORIDE (POUR BTL) OPTIME
TOPICAL | Status: DC | PRN
  Administered 2020-09-30: 1000 mL

## 2020-09-30 MED ORDER — FENTANYL CITRATE (PF) 100 MCG/2ML IJ SOLN
INTRAMUSCULAR | Status: DC | PRN
Start: 2020-09-30 — End: 2020-09-30
  Administered 2020-09-30: 100 ug via INTRAVENOUS

## 2020-09-30 MED ORDER — PROMETHAZINE HCL 25 MG/ML IJ SOLN: 6.2500 mg | INTRAMUSCULAR | Status: DC | PRN

## 2020-09-30 MED ORDER — ONDANSETRON HCL 4 MG/2ML IJ SOLN: 4.0000 mg | Freq: Four times a day (QID) | INTRAMUSCULAR | Status: DC | PRN

## 2020-09-30 MED ORDER — HYDROMORPHONE HCL 1 MG/ML IJ SOLN
0.2500 mg | INTRAMUSCULAR | Status: DC | PRN
  Administered 2020-09-30 (×2): 0.5 mg via INTRAVENOUS

## 2020-09-30 SURGICAL SUPPLY — 34 items
APPLIER CLIP ROT 10 11.4 M/L (STAPLE)
CHLORAPREP W/TINT 26 (MISCELLANEOUS) ×3 IMPLANT
CLIP APPLIE ROT 10 11.4 M/L (STAPLE) IMPLANT
CLOSURE WOUND 1/2 X4 (GAUZE/BANDAGES/DRESSINGS) ×1
COVER SURGICAL LIGHT HANDLE (MISCELLANEOUS) ×3 IMPLANT
COVER WAND RF STERILE (DRAPES) IMPLANT
CUTTER FLEX LINEAR 45M (STAPLE) ×3 IMPLANT
DECANTER SPIKE VIAL GLASS SM (MISCELLANEOUS) ×3 IMPLANT
DERMABOND ADVANCED (GAUZE/BANDAGES/DRESSINGS) ×2
DERMABOND ADVANCED .7 DNX12 (GAUZE/BANDAGES/DRESSINGS) ×1 IMPLANT
DRAPE LAPAROSCOPIC ABDOMINAL (DRAPES) ×3 IMPLANT
ELECT REM PT RETURN 15FT ADLT (MISCELLANEOUS) ×3 IMPLANT
ENDOLOOP SUT PDS II  0 18 (SUTURE)
ENDOLOOP SUT PDS II 0 18 (SUTURE) IMPLANT
GLOVE SURG ORTHO 8.0 STRL STRW (GLOVE) ×3 IMPLANT
GOWN STRL REUS W/TWL XL LVL3 (GOWN DISPOSABLE) ×6 IMPLANT
KIT BASIN OR (CUSTOM PROCEDURE TRAY) ×3 IMPLANT
KIT TURNOVER KIT A (KITS) IMPLANT
PENCIL SMOKE EVACUATOR (MISCELLANEOUS) IMPLANT
POUCH SPECIMEN RETRIEVAL 10MM (ENDOMECHANICALS) ×3 IMPLANT
RELOAD 45 VASCULAR/THIN (ENDOMECHANICALS) IMPLANT
RELOAD STAPLE TA45 3.5 REG BLU (ENDOMECHANICALS) IMPLANT
SET IRRIG TUBING LAPAROSCOPIC (IRRIGATION / IRRIGATOR) ×3 IMPLANT
SET TUBE SMOKE EVAC HIGH FLOW (TUBING) ×3 IMPLANT
SHEARS HARMONIC ACE PLUS 36CM (ENDOMECHANICALS) ×3 IMPLANT
STRIP CLOSURE SKIN 1/2X4 (GAUZE/BANDAGES/DRESSINGS) ×2 IMPLANT
SUT MNCRL AB 4-0 PS2 18 (SUTURE) ×3 IMPLANT
TOWEL OR 17X26 10 PK STRL BLUE (TOWEL DISPOSABLE) ×3 IMPLANT
TOWEL OR NON WOVEN STRL DISP B (DISPOSABLE) ×3 IMPLANT
TRAY FOLEY MTR SLVR 14FR STAT (SET/KITS/TRAYS/PACK) IMPLANT
TRAY FOLEY MTR SLVR 16FR STAT (SET/KITS/TRAYS/PACK) ×3 IMPLANT
TRAY LAPAROSCOPIC (CUSTOM PROCEDURE TRAY) ×3 IMPLANT
TROCAR XCEL BLUNT TIP 100MML (ENDOMECHANICALS) ×3 IMPLANT
TROCAR XCEL NON-BLD 11X100MML (ENDOMECHANICALS) ×3 IMPLANT

## 2020-09-30 NOTE — Anesthesia Preprocedure Evaluation (Addendum)
Anesthesia Evaluation  Patient identified by MRN, date of birth, ID band Patient awake    Reviewed: Allergy & Precautions, NPO status , Patient's Chart, lab work & pertinent test results  History of Anesthesia Complications Negative for: history of anesthetic complications  Airway Mallampati: II  TM Distance: >3 FB Neck ROM: Full    Dental  (+) Dental Advisory Given   Pulmonary former smoker,  09/28/2020 SARS coronavirus NEG   breath sounds clear to auscultation       Cardiovascular (-) anginanegative cardio ROS   Rhythm:Regular Rate:Normal     Neuro/Psych Depression negative neurological ROS     GI/Hepatic Neg liver ROS, abd pain with acute appy   Endo/Other  negative endocrine ROS  Renal/GU negative Renal ROS     Musculoskeletal   Abdominal   Peds  Hematology negative hematology ROS (+)   Anesthesia Other Findings   Reproductive/Obstetrics                            Anesthesia Physical Anesthesia Plan  ASA: II  Anesthesia Plan: General   Post-op Pain Management:    Induction: Intravenous and Rapid sequence  PONV Risk Score and Plan: 2 and Ondansetron and Dexamethasone  Airway Management Planned: Oral ETT  Additional Equipment: None  Intra-op Plan:   Post-operative Plan: Extubation in OR  Informed Consent: I have reviewed the patients History and Physical, chart, labs and discussed the procedure including the risks, benefits and alternatives for the proposed anesthesia with the patient or authorized representative who has indicated his/her understanding and acceptance.     Dental advisory given  Plan Discussed with: CRNA and Surgeon  Anesthesia Plan Comments:        Anesthesia Quick Evaluation

## 2020-09-30 NOTE — Transfer of Care (Signed)
Immediate Anesthesia Transfer of Care Note  Patient: Bassam Dresch  Procedure(s) Performed: APPENDECTOMY LAPAROSCOPIC (N/A Abdomen)  Patient Location: PACU  Anesthesia Type:General  Level of Consciousness: awake, sedated and patient cooperative  Airway & Oxygen Therapy: Patient Spontanous Breathing and Patient connected to face mask oxygen  Post-op Assessment: Report given to RN and Post -op Vital signs reviewed and stable  Post vital signs: stable  Last Vitals:  Vitals Value Taken Time  BP 142/88 09/30/20 1133  Temp    Pulse 77 09/30/20 1138  Resp 26 09/30/20 1138  SpO2 100 % 09/30/20 1138  Vitals shown include unvalidated device data.  Last Pain:  Vitals:   09/30/20 0900  TempSrc: Oral  PainSc: 0-No pain      Patients Stated Pain Goal: 5 (09/30/20 0900)  Complications: No complications documented.

## 2020-09-30 NOTE — Plan of Care (Signed)
  Problem: Education: Goal: Knowledge of General Education information will improve Description: Including pain rating scale, medication(s)/side effects and non-pharmacologic comfort measures Outcome: Adequate for Discharge   Problem: Clinical Measurements: Goal: Ability to maintain clinical measurements within normal limits will improve Outcome: Adequate for Discharge Goal: Will remain free from infection Outcome: Adequate for Discharge Goal: Diagnostic test results will improve Outcome: Adequate for Discharge Goal: Respiratory complications will improve Outcome: Adequate for Discharge Goal: Cardiovascular complication will be avoided Outcome: Adequate for Discharge   Problem: Activity: Goal: Risk for activity intolerance will decrease Outcome: Adequate for Discharge   Problem: Nutrition: Goal: Adequate nutrition will be maintained Outcome: Adequate for Discharge   Problem: Pain Managment: Goal: General experience of comfort will improve Outcome: Adequate for Discharge   Problem: Safety: Goal: Ability to remain free from injury will improve Outcome: Adequate for Discharge   Problem: Skin Integrity: Goal: Risk for impaired skin integrity will decrease Outcome: Adequate for Discharge

## 2020-09-30 NOTE — Discharge Summary (Signed)
[facility] Discharge Summary   Patient ID: Wayne Bowers MRN: [id number] DOB/AGE: 1976-08-21 44 y.o.  Admit date: 09/28/2020 Discharge date: 09/30/2020  Admitting Diagnosis: Acute appendicitis  Discharge Diagnosis Patient Active Problem List   Diagnosis Date Noted  . RLQ abdominal pain 09/28/2020    Consultants None  Imaging: CT Abdomen Pelvis W Contrast  Addendum Date: 09/28/2020   ADDENDUM REPORT: 09/28/2020 17:59 ADDENDUM: Findings conveyed Reatha Armour, MD on 09/28/2020  at17:39. Electronically Signed   By: Genevive Bi M.D.   On: 09/28/2020 17:59   Result Date: 09/28/2020 CLINICAL DATA:  RIGHT lower quadrant pain. EXAM: CT ABDOMEN AND PELVIS WITH CONTRAST TECHNIQUE: Multidetector CT imaging of the abdomen and pelvis was performed using the standard protocol following bolus administration of intravenous contrast. CONTRAST:  OMNIPAQUE IOHEXOL 300 MG/ML  SOLN COMPARISON:  None. FINDINGS: Lower chest: Lung bases are clear. Hepatobiliary: No focal hepatic lesion. No biliary duct dilatation. Common bile duct is normal. Pancreas: Pancreas is normal. No ductal dilatation. No pancreatic inflammation. Spleen: Normal spleen Adrenals/urinary tract: Adrenal glands and kidneys are normal. The ureters and bladder normal. Stomach/Bowel: Stomach, duodenum small-bowel normal. Terminal ileum normal. Appendix is fluid-filled and mildly distended to 8-10 mm (image 54/2 in axial dimension, image 51/6/sagittal imaging). The fluid-filled appendix has mild enhancement of the mucosa. There is no significant inflammation in the fat adjacent to the appendix. No perforation or abscess. Appendix: Location: Typical infra cecal RIGHT lower quadrant. Diameter: 10 mm Appendicolith: Absent Mucosal hyper-enhancement: Mild Extraluminal gas: Absent Periappendiceal collection: Absent The colon and rectosigmoid colon are normal. Vascular/Lymphatic: Abdominal aorta is normal caliber. No periportal or  retroperitoneal adenopathy. No pelvic adenopathy. Reproductive: Prostate unremarkable Other: No free fluid. Musculoskeletal: No aggressive osseous lesion. IMPRESSION: 1. Fluid-filled mildly dilated appendix is concerning for acute appendicitis. No significant peri appendiceal inflammation which could indicate very early appendicitis. Recommend clinical correlation and surgical evaluation. 2. No additional acute findings in the abdomen pelvis. Electronically Signed: By: Genevive Bi M.D. On: 09/28/2020 17:36    Procedures Dr. Gerrit Friends (09/30/2020) - Laparoscopic Appendectomy  Hospital Course:  Wayne Bowers is a 44yo male who presented to [facility] 10/24 with acute onset lower abdominal pain, nausea, and diarrhea.  Workup showed acute appendicitis.  Patient was admitted and underwent procedure listed above.  Tolerated procedure well and was transferred to the floor.  Diet was advanced as tolerated.  On POD0, the patient was voiding well, tolerating diet, ambulating well, pain well controlled, vital signs stable, incisions c/d/i and felt stable for discharge home.  Patient will follow up as below and knows to call with questions or concerns.    I have personally reviewed the patients medication history on the [place] controlled substance database.     Allergies as of 09/30/2020   No Known Allergies     Medication List    TAKE these medications   acetaminophen 325 MG tablet Commonly known as: TYLENOL Take 2 tablets (650 mg total) by mouth every 6 (six) hours as needed for mild pain (or Fever >/= 101).   GAS-X PO Take 1 tablet by mouth once.   ibuprofen 200 MG tablet Commonly known as: ADVIL Take 400 mg by mouth at bedtime.   oxyCODONE 5 MG immediate release tablet Commonly known as: Oxy IR/ROXICODONE Take 1 tablet (5 mg total) by mouth every 6 (six) hours as needed for severe pain.   polyethylene glycol 17 g packet Commonly known as: MiraLax Take 17 g by mouth daily as needed for  mild  constipation.         Follow-up Information    [facility], [place]. Go on 10/16/2020.   Specialty: General Surgery Why: Your appoinment is 10/16/20 at 2 pm Please arrive 30 minutes prior to your appointment to check in and fill out paperwork. Bring photo ID and insurance information. Contact information: [address] [place] [address] [phone]              Signed: Franne Forts, [facility] 09/30/2020, 4:13 PM Please see Amion for pager number during day hours 7:00am-4:30pm

## 2020-09-30 NOTE — Progress Notes (Signed)
Assessment & Plan: RLQ pain, ?early appendicitis  Remains afebrile, WBC normal  Tolerated diet last PM  Persistent pain lower abdomen, tender to palpation this morning with guarding  Discussed findings with patient.  Recommend proceeding with lap appendectomy this morning.  Discussed procedure and post op course to be expected.  Patient agrees to proceed.  Notified nursing and entered orders.  The risks and benefits of the procedure have been discussed at length with the patient.  The patient understands the proposed procedure, potential alternative treatments, and the course of recovery to be expected.  All of the patient's questions have been answered at this time.  The patient wishes to proceed with surgery.   ID - rocephin/flagyl 10/24>> FEN - IVF, CLD, NPO after MN VTE - SCDs, lovenox Foley - none Follow up - TBD        Darnell Level, MD       [facility]       Office: [phone]   Chief Complaint: Abdominal pain  Subjective: Patient in bed, comfortable.  Mild lower abdominal pain.  Objective: Vital signs in last 24 hours: Temp:  [98.2 F (36.8 C)-98.5 F (36.9 C)] 98.3 F (36.8 C) (10/26 0344) Pulse Rate:  [54-63] 54 (10/26 0344) Resp:  [15-20] 18 (10/26 0344) BP: (109-125)/(72-92) 109/74 (10/26 0344) SpO2:  [95 %-100 %] 95 % (10/26 0344) Last BM Date: 09/28/20  Intake/Output from previous day: 10/25 0701 - 10/26 0700 In: 2074.1 [I.V.:1574.1; IV Piggyback:500] Out: -  Intake/Output this shift: No intake/output data recorded.  Physical Exam: HEENT - sclerae clear, mucous membranes moist Neck - soft Chest - clear bilaterally Cor - RRR Abdomen - soft without distension; tender to palpation suprapubic and RLQ with guarding; no mass Ext - no edema, non-tender Neuro - alert & oriented, no focal deficits  Lab Results:  Recent Labs    09/29/20 0407 09/30/20 0255  WBC 7.6 6.6  HGB 13.9 14.2  HCT 42.7 43.1  PLT 230 227    BMET Recent Labs    09/28/20 1504 09/29/20 0407  NA 138 139  K 3.8 3.6  CL 105 105  CO2 23 24  GLUCOSE 116* 100*  BUN 19 15  CREATININE 0.79 0.87  CALCIUM 9.8 8.9   PT/INR No results for input(s): LABPROT, INR in the last 72 hours. Comprehensive Metabolic Panel:    Component Value Date/Time   NA 139 09/29/2020 0407   NA 138 09/28/2020 1504   K 3.6 09/29/2020 0407   K 3.8 09/28/2020 1504   CL 105 09/29/2020 0407   CL 105 09/28/2020 1504   CO2 24 09/29/2020 0407   CO2 23 09/28/2020 1504   BUN 15 09/29/2020 0407   BUN 19 09/28/2020 1504   CREATININE 0.87 09/29/2020 0407   CREATININE 0.79 09/28/2020 1504   GLUCOSE 100 (H) 09/29/2020 0407   GLUCOSE 116 (H) 09/28/2020 1504   CALCIUM 8.9 09/29/2020 0407   CALCIUM 9.8 09/28/2020 1504   AST 29 09/28/2020 1504   ALT 21 09/28/2020 1504   ALKPHOS 49 09/28/2020 1504   BILITOT 0.9 09/28/2020 1504   PROT 7.8 09/28/2020 1504   ALBUMIN 4.7 09/28/2020 1504    Studies/Results: CT Abdomen Pelvis W Contrast  Addendum Date: 09/28/2020   ADDENDUM REPORT: 09/28/2020 17:59 ADDENDUM: Findings conveyed Reatha Armour, MD on 09/28/2020  at17:39. Electronically Signed   By: Genevive Bi M.D.   On: 09/28/2020 17:59   Result Date: 09/28/2020 CLINICAL DATA:  RIGHT lower  quadrant pain. EXAM: CT ABDOMEN AND PELVIS WITH CONTRAST TECHNIQUE: Multidetector CT imaging of the abdomen and pelvis was performed using the standard protocol following bolus administration of intravenous contrast. CONTRAST:  OMNIPAQUE IOHEXOL 300 MG/ML  SOLN COMPARISON:  None. FINDINGS: Lower chest: Lung bases are clear. Hepatobiliary: No focal hepatic lesion. No biliary duct dilatation. Common bile duct is normal. Pancreas: Pancreas is normal. No ductal dilatation. No pancreatic inflammation. Spleen: Normal spleen Adrenals/urinary tract: Adrenal glands and kidneys are normal. The ureters and bladder normal. Stomach/Bowel: Stomach, duodenum small-bowel normal.  Terminal ileum normal. Appendix is fluid-filled and mildly distended to 8-10 mm (image 54/2 in axial dimension, image 51/6/sagittal imaging). The fluid-filled appendix has mild enhancement of the mucosa. There is no significant inflammation in the fat adjacent to the appendix. No perforation or abscess. Appendix: Location: Typical infra cecal RIGHT lower quadrant. Diameter: 10 mm Appendicolith: Absent Mucosal hyper-enhancement: Mild Extraluminal gas: Absent Periappendiceal collection: Absent The colon and rectosigmoid colon are normal. Vascular/Lymphatic: Abdominal aorta is normal caliber. No periportal or retroperitoneal adenopathy. No pelvic adenopathy. Reproductive: Prostate unremarkable Other: No free fluid. Musculoskeletal: No aggressive osseous lesion. IMPRESSION: 1. Fluid-filled mildly dilated appendix is concerning for acute appendicitis. No significant peri appendiceal inflammation which could indicate very early appendicitis. Recommend clinical correlation and surgical evaluation. 2. No additional acute findings in the abdomen pelvis. Electronically Signed: By: Genevive Bi M.D. On: 09/28/2020 17:36      Darnell Level 09/30/2020  Patient ID: Wayne Bowers, male   DOB: 1976-09-13, 44 y.o.   MRN: [id number]

## 2020-09-30 NOTE — Discharge Instructions (Addendum)
For your ear: Clean out with hydrogen peroxide.  If not improving or gets worse (ie you experience increased ear drainage, decreased hearing, worsening ear pain, fever...) recommend going to urgent care for evaluation   [facility]  Please arrive at least 30 min before your appointment to complete your check in paperwork.  If you are unable to arrive 30 min prior to your appointment time we may have to cancel or reschedule you. LAPAROSCOPIC SURGERY: POST OP INSTRUCTIONS Always review your discharge instruction sheet given to you by the facility where your surgery was performed. IF YOU HAVE DISABILITY OR FAMILY LEAVE FORMS, YOU MUST BRING THEM TO THE OFFICE FOR PROCESSING.   DO NOT GIVE THEM TO YOUR DOCTOR.  PAIN CONTROL  1. First take acetaminophen (Tylenol) AND/or ibuprofen (Advil) to control your pain after surgery.  Follow directions on package.  Taking acetaminophen (Tylenol) and/or ibuprofen (Advil) regularly after surgery will help to control your pain and lower the amount of prescription pain medication you may need.  You should not take more than 4,000 mg (4 grams) of acetaminophen (Tylenol) in 24 hours.  You should not take ibuprofen (Advil), aleve, motrin, naprosyn or other NSAIDS if you have a history of stomach ulcers or chronic kidney disease.  2. A prescription for pain medication may be given to you upon discharge.  Take your pain medication as prescribed, if you still have uncontrolled pain after taking acetaminophen (Tylenol) or ibuprofen (Advil). 3. Use ice packs to help control pain. 4. If you need a refill on your pain medication, please contact your pharmacy.  They will contact our office to request authorization. Prescriptions will not be filled after 5pm or on week-ends.  HOME MEDICATIONS 5. Take your usually prescribed medications unless otherwise directed.  DIET 6. You should follow a light diet the first few days after arrival home.  Be sure to  include lots of fluids daily. Avoid fatty, fried foods.   CONSTIPATION 7. It is common to experience some constipation after surgery and if you are taking pain medication.  Increasing fluid intake and taking a stool softener (such as Colace) will usually help or prevent this problem from occurring.  A mild laxative (Milk of Magnesia or Miralax) should be taken according to package instructions if there are no bowel movements after 48 hours.  WOUND/INCISION CARE 8. Most patients will experience some swelling and bruising in the area of the incisions.  Ice packs will help.  Swelling and bruising can take several days to resolve.  9. Unless discharge instructions indicate otherwise, follow guidelines below  a. STERI-STRIPS - you may remove your outer bandages 48 hours after surgery, and you may shower at that time.  You have steri-strips (small skin tapes) in place directly over the incision.  These strips should be left on the skin for 7-10 days.   b. DERMABOND/SKIN GLUE - you may shower in 24 hours.  The glue will flake off over the next 2-3 weeks. 10. Any sutures or staples will be removed at the office during your follow-up visit.  ACTIVITIES 11. You may resume regular (light) daily activities beginning the next day--such as daily self-care, walking, climbing stairs--gradually increasing activities as tolerated.  You may have sexual intercourse when it is comfortable.  Refrain from any heavy lifting or straining until approved by your doctor. a. You may drive when you are no longer taking prescription pain medication, you can comfortably wear a seatbelt, and you can safely maneuver  your car and apply brakes.  FOLLOW-UP 12. You should see your doctor in the office for a follow-up appointment approximately 2-3 weeks after your surgery.  You should have been given your post-op/follow-up appointment when your surgery was scheduled.  If you did not receive a post-op/follow-up appointment, make sure that  you call for this appointment within a day or two after you arrive home to insure a convenient appointment time.  OTHER INSTRUCTIONS  WHEN TO CALL YOUR DOCTOR: 1. Fever over 101.0 2. Inability to urinate 3. Continued bleeding from incision. 4. Increased pain, redness, or drainage from the incision. 5. Increasing abdominal pain  The clinic staff is available to answer your questions during regular business hours.  Please don't hesitate to call and ask to speak to one of the nurses for clinical concerns.  If you have a medical emergency, go to the nearest emergency room or call 911.  A surgeon from [facility] is always on call at the hospital. [address], [place], [place]  [address] ? [address] [address], [place], [place]   [address] [phone] ? [phone] ? FAX [phone]

## 2020-09-30 NOTE — Anesthesia Postprocedure Evaluation (Signed)
Anesthesia Post Note  Patient: Wayne Bowers  Procedure(s) Performed: APPENDECTOMY LAPAROSCOPIC (N/A Abdomen)     Patient location during evaluation: PACU Anesthesia Type: General Level of consciousness: awake and alert, patient cooperative and oriented Pain management: pain level controlled Vital Signs Assessment: post-procedure vital signs reviewed and stable Respiratory status: spontaneous breathing, nonlabored ventilation and respiratory function stable Cardiovascular status: blood pressure returned to baseline and stable Postop Assessment: no apparent nausea or vomiting Anesthetic complications: no   No complications documented.  Last Vitals:  Vitals:   09/30/20 1215 09/30/20 1245  BP: (!) 148/98 (!) 138/93  Pulse: 64 68  Resp: 19 19  Temp:  36.5 C  SpO2: 100% 100%    Last Pain:  Vitals:   09/30/20 1215  TempSrc:   PainSc: 4                  Wise Fees,E. Osha Rane

## 2020-09-30 NOTE — Anesthesia Procedure Notes (Signed)
Procedure Name: Intubation Date/Time: 09/30/2020 10:38 AM Performed by: Donna Bernard, CRNA Pre-anesthesia Checklist: Patient identified, Emergency Drugs available, Suction available, Patient being monitored and Timeout performed Patient Re-evaluated:Patient Re-evaluated prior to induction Oxygen Delivery Method: Circle system utilized Preoxygenation: Pre-oxygenation with 100% oxygen Induction Type: IV induction Ventilation: Mask ventilation without difficulty Laryngoscope Size: Miller and 2 Grade View: Grade I Tube type: Oral Tube size: 7.5 mm Number of attempts: 1 Airway Equipment and Method: Stylet Placement Confirmation: positive ETCO2,  ETT inserted through vocal cords under direct vision,  CO2 detector and breath sounds checked- equal and bilateral Secured at: 21 cm Tube secured with: Tape Dental Injury: Teeth and Oropharynx as per pre-operative assessment

## 2020-09-30 NOTE — Op Note (Signed)
OPERATIVE REPORT - LAPAROSCOPIC APPENDECTOMY  Preop diagnosis:  Acute appendicitis  Postop diagnosis:  same  Procedure:  Laparoscopic appendectomy  Surgeon:  Darnell Level, MD  Anesthesia:  general endotracheal  Estimated blood loss:  minimal  Preparation:  Chlora-prep  Complications:  none  Indications:  Patient is a 44 yo male with RLQ abdominal pain and CT scan demonstrating a dilated appendix containing fluid.  Now for appendectomy.  Procedure:  Patient is brought to the operating room and placed in a supine position on the operating room table. Following administration of general anesthesia, a time out was held and the patient's name and procedure is confirmed. Patient is then prepped and draped in the usual strict aseptic fashion.  After ascertaining that an adequate level of anesthesia has been achieved, a peri-umbilical incision is made with a #15 blade. Dissection is carried down to the fascia. Fascia is incised in the midline and the peritoneal cavity is entered cautiously. A #0-vicryl pursestring suture is placed in the fascia. An Hassan cannula is introduced under direct vision and secured with the pursestring suture. The abdomen is insufflated with carbon dioxide. The laparoscope is introduced and the abdomen is explored. Operative ports are placed in the right upper quadrant and left lower quadrant. The appendix is identified. The mesoappendix is divided with the harmonic scalpel. Dissection is carried down to the base of the appendix. The base of the appendix is dissected out clearing the junction with the cecal wall. Using an Endo-GIA stapler, the base of the appendix is transected at the junction with the cecal wall. There is good approximation of tissue along the staple line. There is good hemostasis along the staple line. The appendix is placed into an endo-catch bag and withdrawn through the umbilical port. The #0-vicryl pursestring suture is tied securely.  Right lower  quadrant is irrigated with warm saline which is evacuated. Good hemostasis is noted. Ports are removed under direct vision. Good hemostasis is noted at the port sites. Pneumoperitoneum is released.  Skin incisions are anesthetized with local anesthetic. Wounds are closed with interrupted 4-0 Monocryl subcuticular sutures. Wounds are washed and dried and Dermabond was applied. The patient is awakened from anesthesia and brought to the recovery room. The patient tolerated the procedure well.  Darnell Level, MD [facility] Office: [phone]

## 2020-10-01 ENCOUNTER — Encounter (HOSPITAL_COMMUNITY): Payer: Self-pay | Admitting: Surgery

## 2020-10-01 LAB — SURGICAL PATHOLOGY

## 2020-11-07 ENCOUNTER — Other Ambulatory Visit: Payer: Self-pay | Admitting: Orthopaedic Surgery

## 2020-11-07 ENCOUNTER — Other Ambulatory Visit: Payer: Self-pay

## 2020-11-07 ENCOUNTER — Ambulatory Visit
Admission: RE | Admit: 2020-11-07 | Discharge: 2020-11-07 | Disposition: A | Discharge status: Home / Self Care | Payer: No Typology Code available for payment source | Source: Ambulatory Visit | Attending: Orthopaedic Surgery | Admitting: Orthopaedic Surgery

## 2020-11-07 DIAGNOSIS — T148XXA Other injury of unspecified body region, initial encounter: Secondary | ICD-10-CM

## 2020-11-10 ENCOUNTER — Other Ambulatory Visit: Payer: Self-pay | Admitting: Orthopaedic Surgery

## 2020-11-10 DIAGNOSIS — S92301A Fracture of unspecified metatarsal bone(s), right foot, initial encounter for closed fracture: Secondary | ICD-10-CM

## 2021-01-13 IMAGING — CT CT FOOT*R* W/O CM
4 of 5 series · 17 of 35 positions shown, 19 images · non-contrast
Comparison: None.

CLINICAL DATA: Right foot crush injury a week ago.

EXAM:
CT OF THE RIGHT FOOT WITHOUT CONTRAST
TECHNIQUE: Multidetector CT imaging of the right foot was performed according
to the standard protocol. Multiplanar CT image reconstructions were
also generated.

[Series 6: soft tissue lower extremity · axial · 0.54mm/px · z∈[-240,-156]mm · 5 of 74 slices shown]
[im 11/74  soft-tissue]
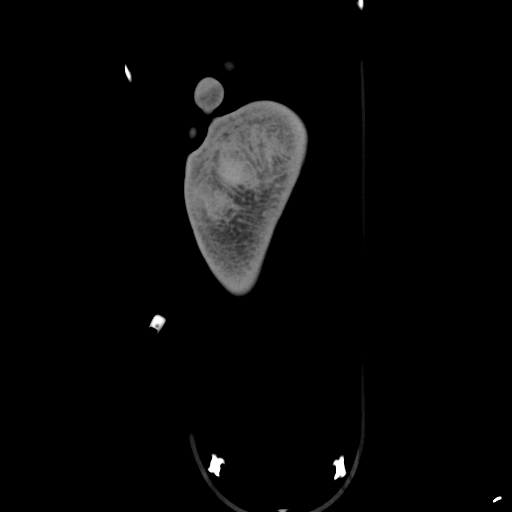
[im 21/74  soft-tissue]
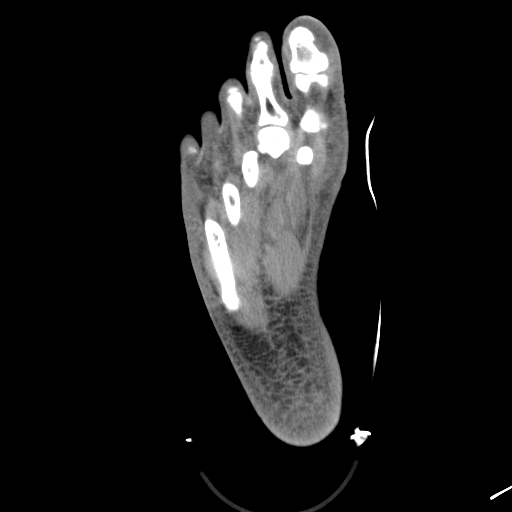
[im 32/74  soft-tissue]
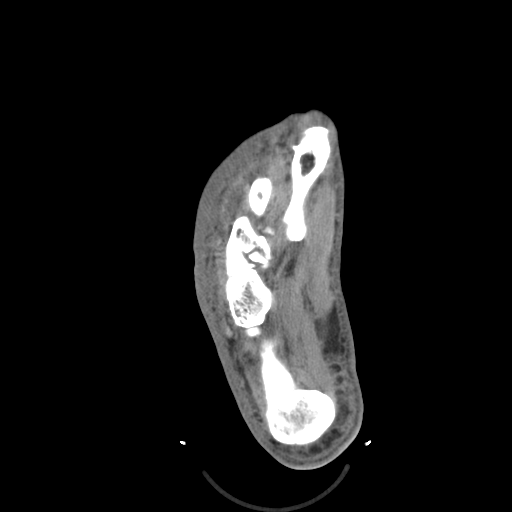
[im 42/74  soft-tissue]
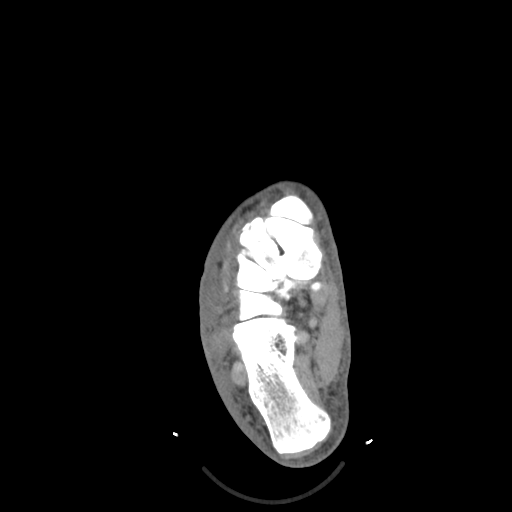
[im 53/74  soft-tissue]
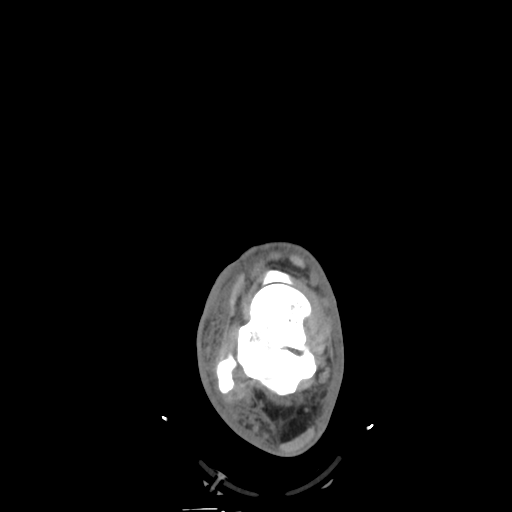

[Series 602: axial bone · coronal · 0.54mm/px · 3 of 122 slices shown]
[im 26/122  bone]
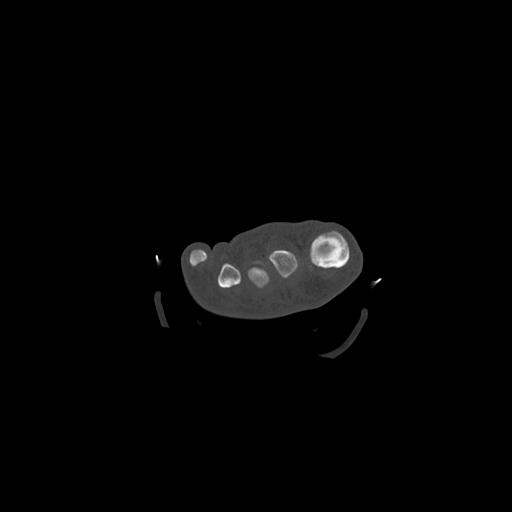
[im 53/122  bone]
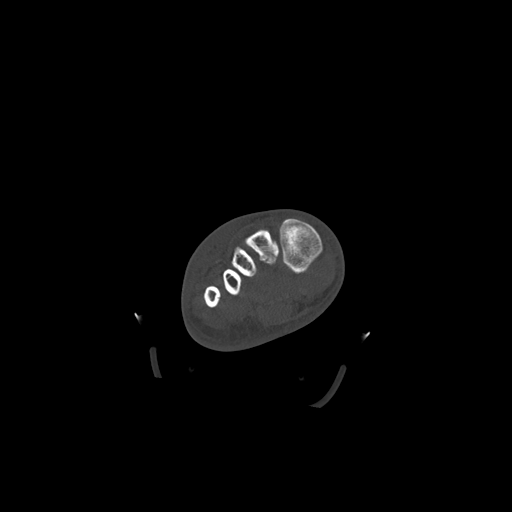
[im 80/122  bone]
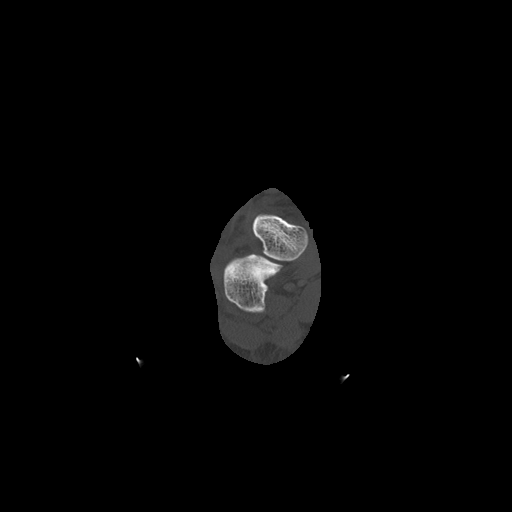

[Series 605: cor soft · axial · 0.54mm/px · z∈[-315,-254]mm · 4 of 58 slices shown, 5 images]
[im 12/58  soft-tissue]
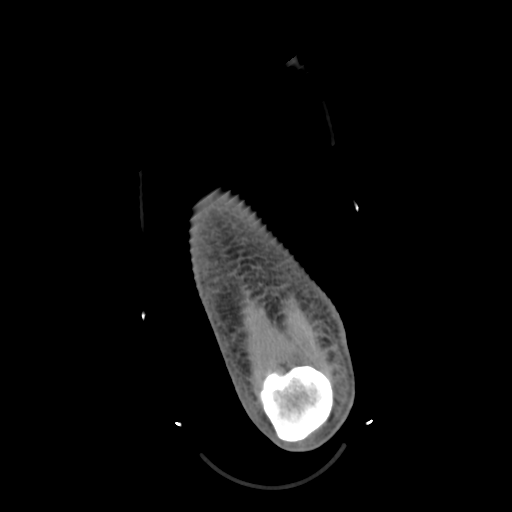
[im 12/58  bone]
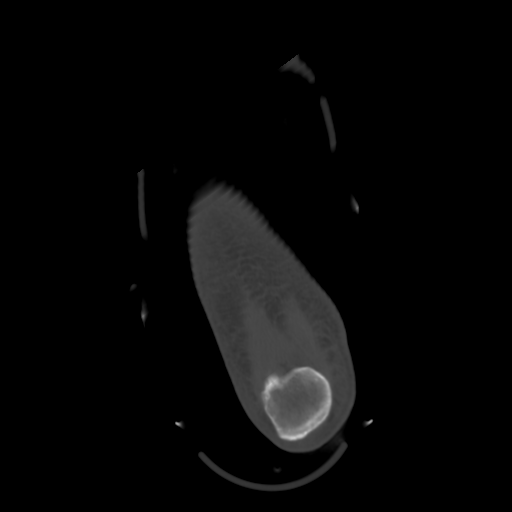
[im 23/58  bone]
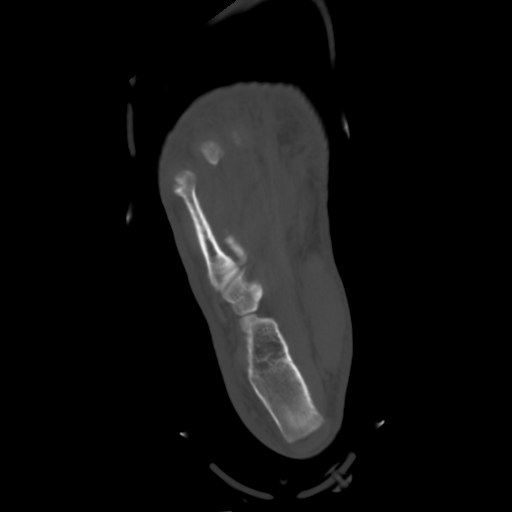
[im 35/58  bone]
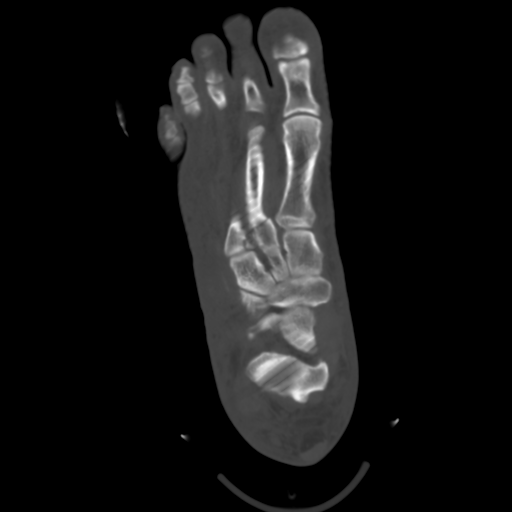
[im 46/58  bone]
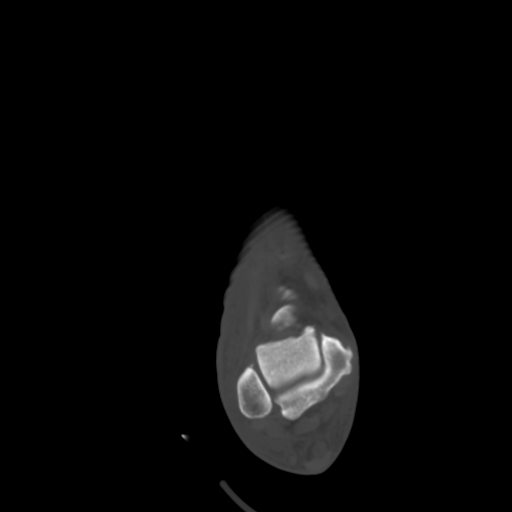

[Series 607: sag soft · sagittal · 0.54mm/px · 5 of 58 slices shown, 6 images]
[im 20/58  bone]
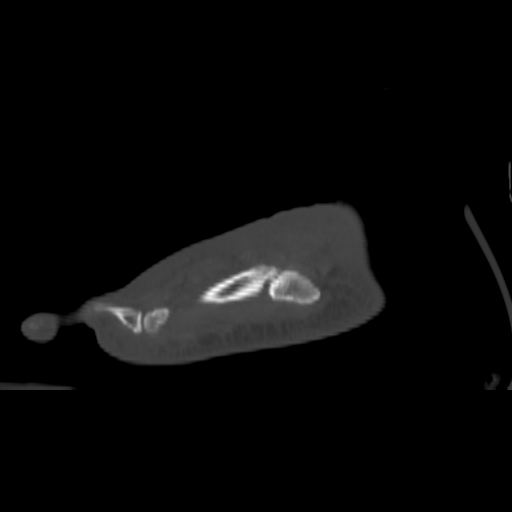
[im 24/58  bone]
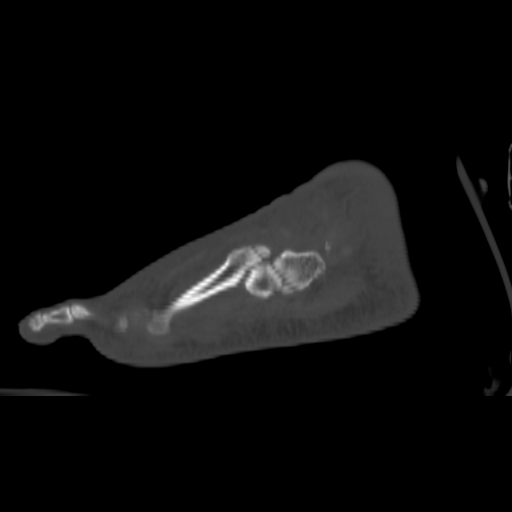
[im 29/58  soft-tissue]
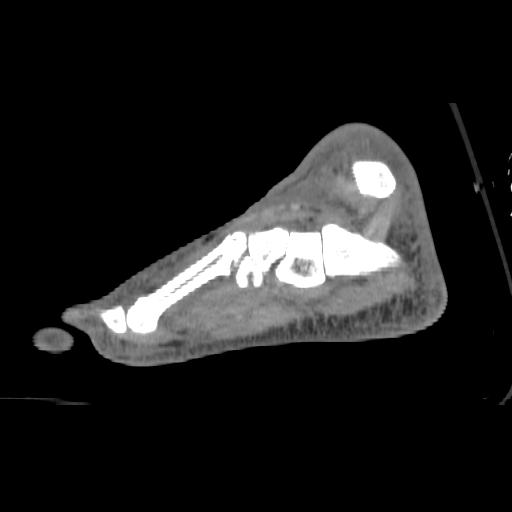
[im 29/58  bone]
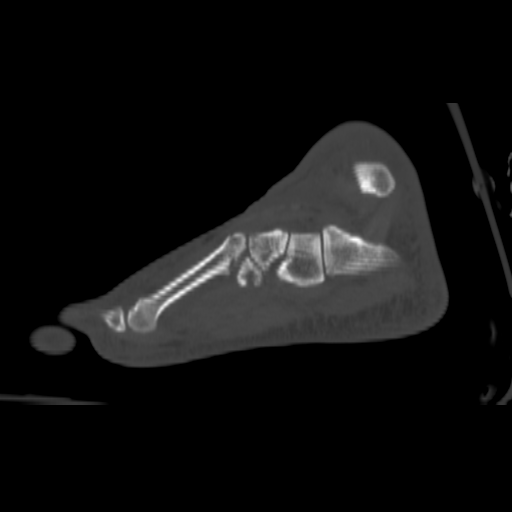
[im 34/58  bone]
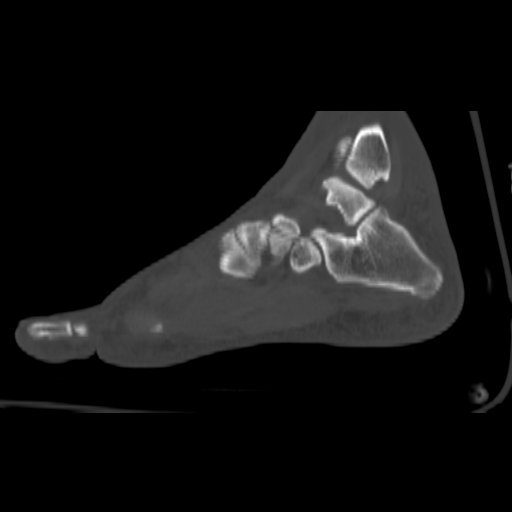
[im 39/58  bone]
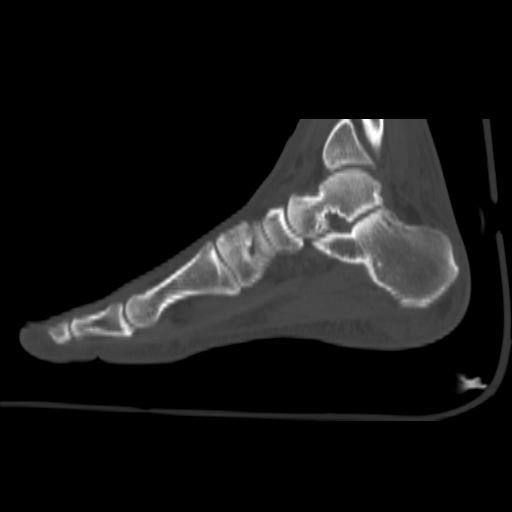

[17 of 35 positions shown; findings below may reference images not displayed]

FINDINGS: Bones/Joint/Cartilage

Acute comminuted nondisplaced fracture at the base of the second
metatarsal. Acute comminuted minimally displaced intra-articular
fracture at the base of the third metatarsal. Tiny acute
nondisplaced fracture of the dorsal and medial aspect of the lateral
cuneiform. Tiny avulsion fracture at the anterolateral aspect of the
calcaneus at the calcaneocuboid joint (series 3, image 38). No
dislocation. Lisfranc alignment is maintained. Mild tibiotalar
degenerative changes. No joint effusion.

Ligaments

Ligaments are suboptimally evaluated by CT.

Muscles and Tendons
Grossly intact.

Soft tissue
Diffuse soft tissue swelling, most prominent over the dorsal foot.
No fluid collection or hematoma. No soft tissue mass.
IMPRESSION: 1. Acute comminuted fractures at the base of the second and third
metatarsals. Maintained Lisfranc alignment.
2. Tiny acute nondisplaced fracture of the dorsal and medial aspect
of the lateral cuneiform.
3. Tiny avulsion fracture of the anterolateral aspect of the
calcaneus at the calcaneocuboid joint.
